# Patient Record
Sex: Male | Born: 1965 | Race: White | Marital: Married | Smoking: Never smoker
Health system: Southern US, Community
[De-identification: ages and names within clinical notes are randomized; demographics above are authoritative.]

## PROBLEM LIST (undated history)

## (undated) DIAGNOSIS — F40240 Claustrophobia: Secondary | ICD-10-CM

## (undated) DIAGNOSIS — G600 Hereditary motor and sensory neuropathy: Secondary | ICD-10-CM

## (undated) DIAGNOSIS — E785 Hyperlipidemia, unspecified: Secondary | ICD-10-CM

## (undated) DIAGNOSIS — G4733 Obstructive sleep apnea (adult) (pediatric): Secondary | ICD-10-CM

## (undated) DIAGNOSIS — R748 Abnormal levels of other serum enzymes: Secondary | ICD-10-CM

## (undated) DIAGNOSIS — K449 Diaphragmatic hernia without obstruction or gangrene: Secondary | ICD-10-CM

## (undated) DIAGNOSIS — K2270 Barrett's esophagus without dysplasia: Secondary | ICD-10-CM

## (undated) DIAGNOSIS — R7303 Prediabetes: Secondary | ICD-10-CM

## (undated) DIAGNOSIS — Z9289 Personal history of other medical treatment: Secondary | ICD-10-CM

## (undated) DIAGNOSIS — G4730 Sleep apnea, unspecified: Secondary | ICD-10-CM

## (undated) DIAGNOSIS — G629 Polyneuropathy, unspecified: Secondary | ICD-10-CM

## (undated) DIAGNOSIS — F419 Anxiety disorder, unspecified: Secondary | ICD-10-CM

## (undated) DIAGNOSIS — I10 Essential (primary) hypertension: Secondary | ICD-10-CM

## (undated) DIAGNOSIS — K219 Gastro-esophageal reflux disease without esophagitis: Secondary | ICD-10-CM

## (undated) HISTORY — DX: Hyperlipidemia, unspecified: E78.5

## (undated) HISTORY — DX: Sleep apnea, unspecified: G47.30

## (undated) HISTORY — PX: WISDOM TOOTH EXTRACTION: SHX21

## (undated) HISTORY — DX: Essential (primary) hypertension: I10

## (undated) HISTORY — PX: COLONOSCOPY: SHX174

## (undated) HISTORY — PX: ESOPHAGOGASTRODUODENOSCOPY ENDOSCOPY: SHX5814

## (undated) HISTORY — DX: Anxiety disorder, unspecified: F41.9

## (undated) HISTORY — DX: Gastro-esophageal reflux disease without esophagitis: K21.9

## (undated) HISTORY — DX: Barrett's esophagus without dysplasia: K22.70

## (undated) HISTORY — PX: VASECTOMY: SHX75

---

## 2020-04-15 ENCOUNTER — Ambulatory Visit: Payer: PRIVATE HEALTH INSURANCE | Admitting: Neurology

## 2020-07-16 ENCOUNTER — Ambulatory Visit: Payer: Self-pay | Admitting: Gastroenterology

## 2020-07-18 ENCOUNTER — Encounter: Payer: Self-pay | Admitting: Internal Medicine

## 2020-07-18 ENCOUNTER — Ambulatory Visit (INDEPENDENT_AMBULATORY_CARE_PROVIDER_SITE_OTHER): Payer: BC Managed Care – PPO | Admitting: Internal Medicine

## 2020-07-18 VITALS — BP 150/90 | HR 81 | Ht 71.0 in | Wt 216.0 lb

## 2020-07-18 DIAGNOSIS — K2270 Barrett's esophagus without dysplasia: Secondary | ICD-10-CM

## 2020-07-18 DIAGNOSIS — K219 Gastro-esophageal reflux disease without esophagitis: Secondary | ICD-10-CM | POA: Diagnosis not present

## 2020-07-18 MED ORDER — FAMOTIDINE 20 MG PO TABS
20.0000 mg | ORAL_TABLET | Freq: Every day | ORAL | 3 refills | Status: DC
Start: 2020-07-18 — End: 2023-06-30

## 2020-07-18 MED ORDER — OMEPRAZOLE 40 MG PO CPDR
40.0000 mg | DELAYED_RELEASE_CAPSULE | Freq: Every day | ORAL | 3 refills | Status: DC
Start: 2020-07-18 — End: 2021-07-06

## 2020-07-18 NOTE — Patient Instructions (Addendum)
If you are age 56 or older, your body mass index should be between 23-30. Your Body mass index is 30.13 kg/m. If this is out of the aforementioned range listed, please consider follow up with your Primary Care Provider.  If you are age 38 or younger, your body mass index should be between 19-25. Your Body mass index is 30.13 kg/m. If this is out of the aformentioned range listed, please consider follow up with your Primary Care Provider.   We have sent the following medications to your pharmacy for you to pick up at your convenience: Omeprazole 40 mg and Famotidine 20 mg.

## 2020-07-18 NOTE — Progress Notes (Signed)
Patient ID: Edward [name], male   DOB: 04-Mar-1966, 53 y.o.   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 Laney Pastor to establish care regarding his reflux disease, Barrett's esophagus. He is here alone today.  He has a history of gastroesophageal reflux disease and heartburn dating back many years. This was initially diagnosed by Dr. West Carbo in [place]. At initial endoscopy it sounds like he had esophagitis as well as Barrett's esophagus. He was started on PPI therapy and had surveillance endoscopy with Dr. West Carbo with improvement in esophagitis. He then switched to Dr. Epimenio Foot at [facility] when Dr. West Carbo retired. He had one upper endoscopy at [facility] in December 2019. See below for details. He has had prior colonoscopy with Dr. West Carbo but he feels that this was less than 10 years ago. He does not recall any abnormalities at this colonoscopy. No family history of colon cancer.  From a GI perspective now he reports his heartburn is well controlled on omeprazole which he takes 40 mg in the morning and famotidine 20 mg in the evening. He can rarely forget the famotidine dose and if he does he can wake up with nocturnal pyrosis which can be moderate. No dysphagia or odynophagia. No weight loss. No nausea or vomiting. Bowel movements are regular. He often has a solid stool each morning and can have a looser stool in the afternoon but this is quite rare. No change in bowel habits, blood in stool or melena.  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 he is married with 2 children. He does drink alcohol 2-4 drinks daily. No caffeine. No tobacco and no former tobacco use. No illicit drug use.  Past Medical History:  Diagnosis Date   Anxiety    Barrett's  esophagus    GERD (gastroesophageal reflux disease)    Hyperlipidemia    Hypertension     History reviewed. No pertinent surgical history.  Outpatient Medications Prior to Visit  Medication Sig Dispense Refill   ALPRAZolam (XANAX) 0.5 MG tablet Take 0.5 mg by mouth 3 (three) times daily as needed.     atorvastatin (LIPITOR) 40 MG tablet Take 40 mg by mouth at bedtime.     benazepril (LOTENSIN) 20 MG tablet Take 20 mg by mouth daily.     famotidine (PEPCID) 20 MG tablet Take 20 mg by mouth 2 (two) times daily.     omeprazole (PRILOSEC) 40 MG capsule Take 40 mg by mouth daily.      No facility-administered medications prior to visit.    No Known Allergies  Family History  Problem Relation Age of Onset   Colon polyps Mother    Diabetes Mother    Heart disease Father    Cancer Paternal Grandmother        ?    Social History   Tobacco Use   Smoking status: Never Smoker   Smokeless tobacco: Never Used  Substance Use Topics   Alcohol use: Yes    Alcohol/week: 4.0 standard drinks    Types: 4 Glasses of wine per week    Comment: 4 drinks  a day   Drug use: Never    ROS: As per history of present illness, otherwise negative  BP (!) 150/90    Pulse 81    Ht 5\' 11"  (1.803  m)    Wt 216 lb (98 kg)    BMI 30.13 kg/m  Constitutional: Well-developed and well-nourished. No distress. HEENT: Normocephalic and atraumatic.  Conjunctivae are normal.  No scleral icterus. Cardiovascular: Normal rate, regular rhythm and intact distal pulses. No M/R/G Pulmonary/chest: Effort normal and breath sounds normal. No wheezing, rales or rhonchi. Abdominal: Soft, nontender, nondistended. Bowel sounds active throughout. There are no masses palpable. No hepatosplenomegaly. Extremities: no clubbing, cyanosis, or edema Neurological: Alert and oriented to person place and time. Skin: Skin is warm and dry.  Psychiatric: Normal mood and affect. Behavior is normal.  RELEVANT GI  PROCEDURES: EGD performed on 08/25/2018, [facility], Dr. Lacinda Axon --Mucosal changes consistent with short segment Barrett's esophagus involving the mucosa 34 to 35 cm from the incisors. Scattered islands of salmon-colored mucosa at 33 cm. Maximal extent 3 cm. Biopsied. Medium size hiatal hernia. Diffuse erythematous mucosa in the gastric antrum which was biopsied. Normal examined duodenum. Pathology: Chronic inflammation of cardia type gastric mucosa negative for Barrett's esophagus.  Squamous mucosa with changes consistent with reflux esophagitis.  Negative for dysplasia.  Gastric biopsies moderate chronic active gastritis positive for H. Pylori. --He was then treated with amoxicillin, Biaxin and Prilosec. --H. pylori stool antigen 11/09/2018 not detected; confirmation of eradication  EGD performed 10/07/2014, Dr. Epimenio Foot --Mucosal changes consistent with short segment Barrett's esophagus Prague criteria C1-M3.  Biopsied.  Small hiatus hernia.  Patchy erythematous mucosa antrum.  Normal duodenum.  Pathology: Focal intestinal metaplasia consistent with Barrett's esophagus.  No dysplasia.  This was the finding at both 34 and 35 cm.  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 Laney Pastor to establish care regarding his reflux disease, Barrett's esophagus.  1.  GERD with Barrett's esophagus, short segment without dysplasia --reflux is well controlled currently on current therapy.  He is up-to-date with surveillance endoscopy.  He actually did not have Barrett's esophagus in 2019 but did in early 2016.  I recommended we continue current therapy and perform surveillance upper endoscopy in December 2022 --Continue omeprazole 40 mg each morning and famotidine 20 mg in the evening --EGD December 2022  for Barrett's surveillance  2.  Colon cancer screening --prior colonoscopy with Dr. West Carbo in [place].  We will request these records to determine when next screening interval is recommended.  Average risk for screening interval for him is 10 years  Annual follow-up, sooner if needed   Cc: Dr. Laney Pastor, MD [employer], [place]

## 2020-08-10 NOTE — Progress Notes (Signed)
Cardiology Office Note   Date:  08/11/2020   ID:  Edward [name], DOB 05-May-1966, MRN [id number]  PCP:  [name], MD  Cardiologist:   No primary care provider on file. Referring:  [name], MD  Chief Complaint  Patient presents with  . Anxiety      History of Present Illness: Edward [name] is a 54 y.o. male who is referred for evaluation of HTN by Hungarland, Jenetta Downer, MD.  The patient has also had a history of chest discomfort.  Is been followed by a cardiologist in [place] but is wanting to switch care.  He reports having a stress test about 10 years ago that was apparently negative for any evidence of ischemia.  I was able to review some notes from her previous cardiology appointment in [place].  He had normal left ventricular ejection fraction.  There were no significant abnormalities on this echo.  This was from September.  He is being seen for difficult to control hypertension.  He has a history of anxiety.  He thinks that this does compound some of there is blood pressure problems.  He works doing deliveries for schools.  He [name] be physically active without bringing on currently any chest discomfort, neck or arm discomfort.  He has no new shortness of breath, PND or orthopnea.  Is not describing any palpitations, presyncope or syncope.  He has no weight gain or edema.   Past Medical History:  Diagnosis Date  . Anxiety   . Barrett's esophagus   . GERD (gastroesophageal reflux disease)   . Hyperlipidemia   . Hypertension     History reviewed. No pertinent surgical history.   Current Outpatient Medications  Medication Sig Dispense Refill  . ALPRAZolam (XANAX) 0.5 MG tablet Take 0.5 mg by mouth 3 (three) times daily as needed.    Marland Kitchen atorvastatin (LIPITOR) 40 MG tablet Take 40 mg by mouth at bedtime.    . famotidine (PEPCID) 20 MG tablet Take 1 tablet (20 mg total) by mouth at bedtime. 90 tablet 3  . omeprazole (PRILOSEC) 40 MG  capsule Take 1 capsule (40 mg total) by mouth daily. 90 capsule 3  . valsartan-hydrochlorothiazide (DIOVAN HCT) 80-12.5 MG tablet Take 1 tablet by mouth daily. 90 tablet 3   No current facility-administered medications for this visit.    Allergies:   Patient has no known allergies.    Social History:  The patient  reports that he has never smoked. He has never used smokeless tobacco. He reports current alcohol use of about 4.0 standard drinks of alcohol per week. He reports that he does not use drugs.   Family History:  The patient's family history includes Cancer in his paternal grandmother; Colon polyps in his mother; Diabetes in his mother; Heart attack in his father and paternal grandfather; Heart disease in his father and another family member; Multiple sclerosis in his mother; Stroke in his father.    ROS:  Please see the history of present illness.   Otherwise, review of systems are positive for none.   All other systems are reviewed and negative.    PHYSICAL EXAM: VS:  BP (!) 140/100   Pulse 86   Ht 5\' 11"  (1.803 m)   Wt 219 lb (99.3 kg)   SpO2 98%   BMI 30.54 kg/m  , BMI Body mass index is 30.54 kg/m. GENERAL:  Well appearing HEENT:  Pupils equal round and reactive, fundi not visualized, oral mucosa unremarkable NECK:  No jugular venous distention, waveform within normal limits, carotid upstroke brisk and symmetric, no bruits, no thyromegaly LYMPHATICS:  No cervical, inguinal adenopathy LUNGS:  Clear to auscultation bilaterally BACK:  No CVA tenderness CHEST:  Unremarkable HEART:  PMI not displaced or sustained,S1 and S2 within normal limits, no S3, no S4, no clicks, no rubs, no murmurs ABD:  Flat, positive bowel sounds normal in frequency in pitch, no bruits, no rebound, no guarding, no midline pulsatile mass, no hepatomegaly, no splenomegaly EXT:  2 plus pulses throughout, no edema, no cyanosis no clubbing SKIN:  No rashes no nodules NEURO:  Cranial nerves II through  XII grossly intact, motor grossly intact throughout PSYCH:  Cognitively intact, oriented to person place and time    EKG:  EKG is ordered today. The ekg ordered today demonstrates sinus rhythm, rate 86, axis within normal limits, intervals within normal limits, no acute ST-T wave changes.   Recent Labs: No results found for requested labs within last 8760 hours.    Lipid Panel No results found for: CHOL, TRIG, HDL, CHOLHDL, VLDL, LDLCALC, LDLDIRECT    Wt Readings from Last 3 Encounters:  08/11/20 219 lb (99.3 kg)  07/18/20 216 lb (98 kg)      Other studies Reviewed: Additional studies/ records that were reviewed today include: Primary care and cardiology office records from [place]. Review of the above records demonstrates:  Please see elsewhere in the note.     ASSESSMENT AND PLAN:  HTN: His blood pressure has been difficult to control.  We talked about this at length.  We talked the physiology.  I am going to stop his Lotensin and start him on valsartan HCT 80/12.5 daily.  We talked about weight loss.  He already follows salt restriction.  He is physically active.  We talked about anxiety as a contributor.  OVERWEIGHT: As above  DYSLIPIDEMIA: I do note that he has dyslipidemia.  His LDL was 144 and he was recently started on statin.  Given his family history I think this is reasonable.  The goal should be an LDL less than 100 at least.   Current medicines are reviewed at length with the patient today.  The patient does not have concerns regarding medicines.  The following changes have been made:  no change  Labs/ tests ordered today include:   Orders Placed This Encounter  Procedures  . EKG 12-Lead     Disposition:   FU with me in 3 months.   Signed, Minus Breeding, MD  08/11/2020 11:57 AM    [facility]

## 2020-08-11 ENCOUNTER — Telehealth: Payer: Self-pay

## 2020-08-11 ENCOUNTER — Encounter: Payer: Self-pay | Admitting: Cardiology

## 2020-08-11 ENCOUNTER — Other Ambulatory Visit: Payer: Self-pay

## 2020-08-11 ENCOUNTER — Ambulatory Visit (INDEPENDENT_AMBULATORY_CARE_PROVIDER_SITE_OTHER): Payer: BC Managed Care – PPO | Admitting: Cardiology

## 2020-08-11 VITALS — BP 140/100 | HR 86 | Ht 71.0 in | Wt 219.0 lb

## 2020-08-11 DIAGNOSIS — I10 Essential (primary) hypertension: Secondary | ICD-10-CM | POA: Diagnosis not present

## 2020-08-11 MED ORDER — VALSARTAN-HYDROCHLOROTHIAZIDE 80-12.5 MG PO TABS: 1.0000 | ORAL_TABLET | Freq: Every day | ORAL | 3 refills | Status: DC

## 2020-08-11 NOTE — Patient Instructions (Signed)
Medication Instructions:  STOP LOTENSIN START DIOVAN 80-12.5MG  DAILY *If you need a refill on your cardiac medications before your next appointment, please call your pharmacy*  Lab Work: NONE ORDERED THIS VISIT  Testing/Procedures: NONE ORDERED THIS VISIT  Follow-Up: At [facility], you and your health needs are our priority.  As part of our continuing mission to provide you with exceptional heart care, we have created designated Provider Care Teams.  These Care Teams include your primary Cardiologist (physician) and Advanced Practice Providers (APPs -  Physician Assistants and Nurse Practitioners) who all work together to provide you with the care you need, when you need it.  We recommend signing up for the patient portal called "MyChart".  Sign up information is provided on this After Visit Summary.  MyChart is used to connect with patients for Virtual Visits (Telemedicine).  Patients are able to view lab/test results, encounter notes, upcoming appointments, etc.  Non-urgent messages can be sent to your provider as well.   To learn more about what you can do with MyChart, go to [email or web address].    Your next appointment:   3 month(s)  The format for your next appointment:   In Person  Provider:   Minus Breeding, MD

## 2020-08-11 NOTE — Telephone Encounter (Signed)
Called to have records faxed for 11am appointment.

## 2020-10-14 ENCOUNTER — Other Ambulatory Visit: Payer: Self-pay

## 2020-10-14 ENCOUNTER — Telehealth: Payer: Self-pay | Admitting: Internal Medicine

## 2020-10-14 ENCOUNTER — Other Ambulatory Visit: Payer: BC Managed Care – PPO

## 2020-10-14 DIAGNOSIS — R197 Diarrhea, unspecified: Secondary | ICD-10-CM

## 2020-10-14 NOTE — Telephone Encounter (Signed)
Pt states that he has been having severe diarrhea since last Tuesday. He went to urgent care yesterday and was prescribed Hyoscyamine 0.125 mg, take 1 tablet every 4 hours for 3 days. He wants to know if Dr. Hilarie Fredrickson is fine with that and what his recommendations are to help him.

## 2020-10-14 NOTE — Telephone Encounter (Signed)
Gi pathogen panel Focus on hydration and BRAT diet

## 2020-10-14 NOTE — Telephone Encounter (Signed)
Called patient back and he states since last Thursday 10/09/20 he has been having dark liquid stool, several times a day and at night. Only 1 stool in the last 6 days was semi-formed. He has had mid-abdominal pain and nausea, but denies fever, emesis. He has been eating a bland diet and pushing fluids. He went to Urgent Care yesterday and they gave him Hyoscyamine, of which he has taken 3 doses with no improvement. Please advise.

## 2020-10-14 NOTE — Telephone Encounter (Signed)
Order in [place] for GI pathogen panel. Called patient and he will come in today or tomorrow for this. Asked him to stay on a BRAT diet and continue to push fluids. He agreed.

## 2020-10-15 ENCOUNTER — Telehealth: Payer: Self-pay | Admitting: Internal Medicine

## 2020-10-16 ENCOUNTER — Telehealth: Payer: Self-pay | Admitting: Internal Medicine

## 2020-10-16 LAB — GI PROFILE, STOOL, PCR
Adenovirus F 40/41: NOT DETECTED
Astrovirus: NOT DETECTED
C difficile toxin A/B: NOT DETECTED
Campylobacter: NOT DETECTED
Cryptosporidium: NOT DETECTED
Cyclospora cayetanensis: NOT DETECTED
Entamoeba histolytica: NOT DETECTED
Enteroaggregative E coli: NOT DETECTED
Enteropathogenic E coli: NOT DETECTED
Enterotoxigenic E coli: DETECTED — AB
Giardia lamblia: NOT DETECTED
Norovirus GI/GII: NOT DETECTED
Plesiomonas shigelloides: NOT DETECTED
Rotavirus A: NOT DETECTED
Salmonella: NOT DETECTED
Sapovirus: NOT DETECTED
Shiga-toxin-producing E coli: NOT DETECTED
Shigella/Enteroinvasive E coli: NOT DETECTED
Vibrio cholerae: NOT DETECTED
Vibrio: NOT DETECTED
Yersinia enterocolitica: NOT DETECTED

## 2020-10-16 NOTE — Telephone Encounter (Signed)
See result note.  

## 2020-10-16 NOTE — Telephone Encounter (Signed)
Inbound call from [place] stating patient's Enterotoxigenic E coli came back positive.

## 2020-10-16 NOTE — Telephone Encounter (Signed)
See additional note. 

## 2020-10-16 NOTE — Telephone Encounter (Signed)
Pt calling for lab results, please advise. 

## 2020-10-17 NOTE — Telephone Encounter (Signed)
Spoke to pt and he is aware. Letter sent to him via mychart.

## 2020-10-17 NOTE — Telephone Encounter (Signed)
Infection should be better by 1 week.  Okay to return to work once diarrhea is gone.  Work note if needed

## 2020-10-17 NOTE — Telephone Encounter (Signed)
Pt had e coli intestinal infection. He has been told to wash his hands well after toileting. Pt wants to know how long this can be transmitted and if he needs to be out of work. 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 Please advise.

## 2020-10-17 NOTE — Telephone Encounter (Signed)
Patient calling back; has additional questions about his results.

## 2020-11-09 DIAGNOSIS — E785 Hyperlipidemia, unspecified: Secondary | ICD-10-CM | POA: Insufficient documentation

## 2020-11-09 DIAGNOSIS — E663 Overweight: Secondary | ICD-10-CM | POA: Insufficient documentation

## 2020-11-09 DIAGNOSIS — I10 Essential (primary) hypertension: Secondary | ICD-10-CM | POA: Insufficient documentation

## 2020-11-09 NOTE — Progress Notes (Unsigned)
Cardiology Office Note   Date:  11/10/2020   ID:  Edward [name], DOB Jul 19, 1966, MRN [id number]  PCP:  [name], MD  Cardiologist:   No primary care provider on file. Referring:  [name], MD  Chief Complaint  Patient presents with  . Elevated Cholesterol      History of Present Illness: Edward [name] is a 55 y.o. male who was referred for evaluation of HTN by Hungarland, Jenetta Downer, MD.  The patient has also had a history of chest discomfort.  He was followed by a cardiologist in [place] but switched care.  He reports having a stress test about 10 years ago that was apparently negative for any evidence of ischemia.  I was able to review some notes from her previous cardiology appointment in [place].  He had normal left ventricular ejection fraction.  There were no significant abnormalities on this echo.  This was from September 2021.   At the last visit I started valsartan 80/12.5.  He is bringing his blood pressure diary to me and his diastolics are consistently above 100.  His systolics seem to be in about the 135 range.  He denies any cardiovascular symptoms. The patient denies any new symptoms such as chest discomfort, neck or arm discomfort. There has been no new shortness of breath, PND or orthopnea. There have been no reported palpitations, presyncope or syncope.    Past Medical History:  Diagnosis Date  . Anxiety   . Barrett's esophagus   . GERD (gastroesophageal reflux disease)   . Hyperlipidemia   . Hypertension     History reviewed. No pertinent surgical history.   Current Outpatient Medications  Medication Sig Dispense Refill  . ALPRAZolam (XANAX) 0.5 MG tablet Take 0.5 mg by mouth 3 (three) times daily as needed.    Marland Kitchen atorvastatin (LIPITOR) 40 MG tablet Take 40 mg by mouth at bedtime.    . famotidine (PEPCID) 20 MG tablet Take 1 tablet (20 mg total) by mouth at bedtime. 90 tablet 3  . omeprazole (PRILOSEC) 40 MG capsule  Take 1 capsule (40 mg total) by mouth daily. 90 capsule 3  . valsartan-hydrochlorothiazide (DIOVAN HCT) 160-12.5 MG tablet Take 1 tablet by mouth daily. 90 tablet 3   No current facility-administered medications for this visit.    Allergies:   Patient has no known allergies.    ROS:  Please see the history of present illness.   Otherwise, review of systems are positive for none.   All other systems are reviewed and negative.    PHYSICAL EXAM: VS:  BP (!) 138/100   Pulse 82   Ht 5\' 11"  (1.803 m)   Wt 220 lb 9.6 oz (100.1 kg)   SpO2 98%   BMI 30.77 kg/m  , BMI Body mass index is 30.77 kg/m. GENERAL:  Well appearing NECK:  No jugular venous distention, waveform within normal limits, carotid upstroke brisk and symmetric, no bruits, no thyromegaly LUNGS:  Clear to auscultation bilaterally CHEST:  Unremarkable HEART:  PMI not displaced or sustained,S1 and S2 within normal limits, no S3, no S4, no clicks, no rubs, no murmurs ABD:  Flat, positive bowel sounds normal in frequency in pitch, no bruits, no rebound, no guarding, no midline pulsatile mass, no hepatomegaly, no splenomegaly EXT:  2 plus pulses throughout, no edema, no cyanosis no clubbing   EKG:  EKG is not ordered today.   Recent Labs: No results found for requested labs within last 8760 hours.  Lipid Panel No results found for: CHOL, TRIG, HDL, CHOLHDL, VLDL, LDLCALC, LDLDIRECT    Wt Readings from Last 3 Encounters:  11/10/20 220 lb 9.6 oz (100.1 kg)  08/11/20 219 lb (99.3 kg)  07/18/20 216 lb (98 kg)      Other studies Reviewed: Additional studies/ records that were reviewed today include: Labs Review of the above records demonstrates:  Please see elsewhere in the note.     ASSESSMENT AND PLAN:  HTN:   His blood pressure not quite at target.  I am going to increase his valsartan to 180/12 and half.  He will continue to keep his blood pressure diary and if he is not at target we might consider switching  this to nighttime to see if that makes any difference or otherwise he will need further med titration.  DYSLIPIDEMIA:   His LDL was elevated.  He does have a strong family history.  I am going to order a coronary calcium score to further set her therapy.  I did start him on statin as above.  I am going to check a fasting lipid profile.  He has had some mildly elevated liver enzymes in the past so we will repeat this.  I also do not see a CBC in any of his old blood work and I will check this.    Current medicines are reviewed at length with the patient today.  The patient does not have concerns regarding medicines.  The following changes have been made:  As above  Labs/ tests ordered today include:   Orders Placed This Encounter  Procedures  . CT CARDIAC SCORING (SELF PAY ONLY)  . Lipid panel  . Hepatic function panel  . CBC     Disposition:   FU with me in 3 months.   Signed, Minus Breeding, MD  11/10/2020 11:49 AM    [facility]

## 2020-11-10 ENCOUNTER — Other Ambulatory Visit: Payer: Self-pay

## 2020-11-10 ENCOUNTER — Ambulatory Visit (INDEPENDENT_AMBULATORY_CARE_PROVIDER_SITE_OTHER): Payer: BC Managed Care – PPO | Admitting: Cardiology

## 2020-11-10 ENCOUNTER — Encounter: Payer: Self-pay | Admitting: Cardiology

## 2020-11-10 VITALS — BP 138/100 | HR 82 | Ht 71.0 in | Wt 220.6 lb

## 2020-11-10 DIAGNOSIS — E785 Hyperlipidemia, unspecified: Secondary | ICD-10-CM | POA: Diagnosis not present

## 2020-11-10 DIAGNOSIS — Z8249 Family history of ischemic heart disease and other diseases of the circulatory system: Secondary | ICD-10-CM

## 2020-11-10 DIAGNOSIS — I10 Essential (primary) hypertension: Secondary | ICD-10-CM | POA: Diagnosis not present

## 2020-11-10 DIAGNOSIS — E663 Overweight: Secondary | ICD-10-CM | POA: Diagnosis not present

## 2020-11-10 LAB — LIPID PANEL
Chol/HDL Ratio: 2.7 ratio (ref 0.0–5.0)
Cholesterol, Total: 198 mg/dL (ref 100–199)
HDL: 73 mg/dL (ref >39)
LDL Chol Calc (NIH): 102 mg/dL — ABNORMAL HIGH (ref 0–99)
Triglycerides: 132 mg/dL (ref 0–149)
VLDL Cholesterol Cal: 23 mg/dL (ref 5–40)

## 2020-11-10 LAB — CBC
Hematocrit: 51 % (ref 37.5–51.0)
Hemoglobin: 17.4 g/dL (ref 13.0–17.7)
MCH: 32.5 pg (ref 26.6–33.0)
MCHC: 34.1 g/dL (ref 31.5–35.7)
MCV: 95 fL (ref 79–97)
Platelets: 208 10*3/uL (ref 150–450)
RBC: 5.35 x10E6/uL (ref 4.14–5.80)
RDW: 12.7 % (ref 11.6–15.4)
WBC: 6.9 10*3/uL (ref 3.4–10.8)

## 2020-11-10 LAB — HEPATIC FUNCTION PANEL
ALT: 46 IU/L — ABNORMAL HIGH (ref 0–44)
AST: 32 IU/L (ref 0–40)
Albumin: 4.9 g/dL (ref 3.8–4.9)
Alkaline Phosphatase: 88 IU/L (ref 44–121)
Bilirubin Total: 0.6 mg/dL (ref 0.0–1.2)
Bilirubin, Direct: 0.16 mg/dL (ref 0.00–0.40)
Total Protein: 7.3 g/dL (ref 6.0–8.5)

## 2020-11-10 MED ORDER — VALSARTAN-HYDROCHLOROTHIAZIDE 160-12.5 MG PO TABS: 1.0000 | ORAL_TABLET | Freq: Every day | ORAL | 3 refills | Status: DC

## 2020-11-10 NOTE — Patient Instructions (Signed)
Medication Instructions:  Increase Diovan to 160-12.5mg  daily, new prescription sent to [facility] on [address] *If you need a refill on your cardiac medications before your next appointment, please call your pharmacy*  Lab Work: Your physician recommends that you return for lab work today: CBC, Lipids, Liver If you have labs (blood work) drawn today and your tests are completely normal, you will receive your results only by: Marland Kitchen MyChart Message (if you have MyChart) OR . A paper copy in the mail If you have any lab test that is abnormal or we need to change your treatment, we will call you to review the results.  Testing/Procedures: CT Calcium Score on day of next appointment  Follow-Up: At [facility], you and your health needs are our priority.  As part of our continuing mission to provide you with exceptional heart care, we have created designated Provider Care Teams.  These Care Teams include your primary Cardiologist (physician) and Advanced Practice Providers (APPs -  Physician Assistants and Nurse Practitioners) who all work together to provide you with the care you need, when you need it.  MyChart is used to connect with patients for Virtual Visits (Telemedicine).  Patients are able to view lab/test results, encounter notes, upcoming appointments, etc.  Non-urgent messages can be sent to your provider as well.    Your next appointment:   3 month(s)  The format for your next appointment:   In Person  Provider:   Minus Breeding, MD

## 2021-01-12 ENCOUNTER — Other Ambulatory Visit: Payer: BC Managed Care – PPO

## 2021-02-11 NOTE — Progress Notes (Signed)
Cardiology Office Note   Date:  02/12/2021   ID:  Edward [name], DOB Aug 24, 1966, MRN [id number]  PCP:  [name], MD  Cardiologist:   None Referring:  [name], MD  No chief complaint on file.     History of Present Illness: Arthor [name] is a 55 y.o. male who was referred for evaluation of HTN by Hungarland, Jenetta Downer, MD.  The patient has also had a history of chest discomfort.  He was followed by a cardiologist in [place] but switched care.  He reports having a stress test about 10 years ago that was apparently negative for any evidence of ischemia.  I was able to review some notes from her previous cardiology appointment in [place].  He had normal left ventricular ejection fraction.  There were no significant abnormalities on this echo.  This was from September 2021.   At the last visit I increased his Valsartan/HCT.   He comes with excellent blood pressure readings and he still having morning blood pressures that are in the 140s over 100 range fairly consistently.  With the increased blood pressure medicine he did come down but he still not quite at target.  He has some snoring and daytime somnolence (high Epworth sleepiness scale).  He denies any chest pressure, neck or arm discomfort.  He had no new shortness of breath, PND or orthopnea.  He did have a coronary calcium score today and I can see that he has a large hiatal hernia but I do not yet have the score.  I do see some calcium on up luminary readings  Past Medical History:  Diagnosis Date   Anxiety    Barrett's esophagus    GERD (gastroesophageal reflux disease)    Hyperlipidemia    Hypertension     No past surgical history on file.   Current Outpatient Medications  Medication Sig Dispense Refill   ALPRAZolam (XANAX) 0.5 MG tablet Take 0.5 mg by mouth 3 (three) times daily as needed.     atorvastatin (LIPITOR) 40 MG tablet Take 40 mg by mouth at bedtime.     famotidine (PEPCID)  20 MG tablet Take 1 tablet (20 mg total) by mouth at bedtime. 90 tablet 3   omeprazole (PRILOSEC) 40 MG capsule Take 1 capsule (40 mg total) by mouth daily. 90 capsule 3   valsartan-hydrochlorothiazide (DIOVAN HCT) 160-12.5 MG tablet Take 1 tablet by mouth daily. 90 tablet 3   No current facility-administered medications for this visit.    Allergies:   Patient has no known allergies.    ROS:  Please see the history of present illness.   Otherwise, review of systems are positive for none.   All other systems are reviewed and negative.    PHYSICAL EXAM: VS:  BP (!) 156/92 (BP Location: Left Arm, Patient Position: Sitting, Cuff Size: Normal)   Pulse 84   Ht 5\' 11"  (1.803 m)   Wt 221 lb 9.6 oz (100.5 kg)   SpO2 96%   BMI 30.91 kg/m  , BMI Body mass index is 30.91 kg/m. GENERAL:  Well appearing NECK:  No jugular venous distention, waveform within normal limits, carotid upstroke brisk and symmetric, no bruits, no thyromegaly LUNGS:  Clear to auscultation bilaterally CHEST:  Unremarkable HEART:  PMI not displaced or sustained,S1 and S2 within normal limits, no S3, no S4, no clicks, no rubs, no murmurs ABD:  Flat, positive bowel sounds normal in frequency in pitch, no bruits, no rebound, no  guarding, no midline pulsatile mass, no hepatomegaly, no splenomegaly EXT:  2 plus pulses throughout, no edema, no cyanosis no clubbing   EKG:  EKG is not ordered today.   Recent Labs: 11/10/2020: ALT 46; Hemoglobin 17.4; Platelets 208    Lipid Panel    Component Value Date/Time   CHOL 198 11/10/2020 1132   TRIG 132 11/10/2020 1132   HDL 73 11/10/2020 1132   CHOLHDL 2.7 11/10/2020 1132   LDLCALC 102 (H) 11/10/2020 1132      Wt Readings from Last 3 Encounters:  02/12/21 221 lb 9.6 oz (100.5 kg)  11/10/20 220 lb 9.6 oz (100.1 kg)  08/11/20 219 lb (99.3 kg)      Other studies Reviewed: Additional studies/ records that were reviewed today include: Calcium score and BP diary. Review of  the above records demonstrates:  Please see elsewhere in the note.     ASSESSMENT AND PLAN:  HTN:   The blood pressure still not quite at target.  I will get a add amlodipine 2.5 mg at night.  DYSLIPIDEMIA:   His LDL was 102.  Goals of therapy will be based on his calcium score.  ELEVATED CORONARY CALCIUM: I see some of this preliminarily on his CT but I will await the results.  He may need stress testing.  We will discuss risk reduction.  SNORING: Given this and his high sleepiness score as well as difficult to control hypertension I am going to add a sleep study.  Current medicines are reviewed at length with the patient today.  The patient does not have concerns regarding medicines.  The following changes have been made:   As above  Labs/ tests ordered today include:   No orders of the defined types were placed in this encounter.    Disposition:   FU with me in 12 months.   Signed, Minus Breeding, MD  02/12/2021 11:08 AM    [place]

## 2021-02-12 ENCOUNTER — Other Ambulatory Visit: Payer: Self-pay

## 2021-02-12 ENCOUNTER — Ambulatory Visit (INDEPENDENT_AMBULATORY_CARE_PROVIDER_SITE_OTHER): Payer: BC Managed Care – PPO | Admitting: Cardiology

## 2021-02-12 ENCOUNTER — Ambulatory Visit (INDEPENDENT_AMBULATORY_CARE_PROVIDER_SITE_OTHER)
Admission: RE | Admit: 2021-02-12 | Discharge: 2021-02-12 | Disposition: A | Discharge status: Home / Self Care | Payer: Self-pay | Source: Ambulatory Visit | Attending: Cardiology | Admitting: Cardiology

## 2021-02-12 ENCOUNTER — Encounter: Payer: Self-pay | Admitting: Cardiology

## 2021-02-12 VITALS — BP 156/92 | HR 84 | Ht 71.0 in | Wt 221.6 lb

## 2021-02-12 DIAGNOSIS — R0683 Snoring: Secondary | ICD-10-CM

## 2021-02-12 DIAGNOSIS — E785 Hyperlipidemia, unspecified: Secondary | ICD-10-CM

## 2021-02-12 DIAGNOSIS — I10 Essential (primary) hypertension: Secondary | ICD-10-CM | POA: Diagnosis not present

## 2021-02-12 DIAGNOSIS — Z8249 Family history of ischemic heart disease and other diseases of the circulatory system: Secondary | ICD-10-CM

## 2021-02-12 MED ORDER — AMLODIPINE BESYLATE 2.5 MG PO TABS: 2.5000 mg | ORAL_TABLET | Freq: Every day | ORAL | 3 refills | Status: DC

## 2021-02-12 NOTE — Patient Instructions (Addendum)
Medication Instructions:  START AMLODIPINE 2.5 MG AT BEDTIME   *If you need a refill on your cardiac medications before your next appointment, please call your pharmacy*  Lab Work: NONE   Testing/Procedures: Your physician has recommended that you have a sleep study. This test records several body functions during sleep, including: brain activity, eye movement, oxygen and carbon dioxide blood levels, heart rate and rhythm, breathing rate and rhythm, the flow of air through your mouth and nose, snoring, body muscle movements, and chest and belly movement. ONCE INSURANCE HAS BEEN REVIEWED THE OFFICE WILL CALL YOU TO ARRANGE   Follow-Up: At [facility], you and your health needs are our priority.  As part of our continuing mission to provide you with exceptional heart care, we have created designated Provider Care Teams.  These Care Teams include your primary Cardiologist (physician) and Advanced Practice Providers (APPs -  Physician Assistants and Nurse Practitioners) who all work together to provide you with the care you need, when you need it.  We recommend signing up for the patient portal called "MyChart".  Sign up information is provided on this After Visit Summary.  MyChart is used to connect with patients for Virtual Visits (Telemedicine).  Patients are able to view lab/test results, encounter notes, upcoming appointments, etc.  Non-urgent messages can be sent to your provider as well.   To learn more about what you can do with MyChart, go to [email or web address].    Your next appointment:   12 month(s)  The format for your next appointment:   In Person  Provider:   You may see DR [facility]  or one of the following Advanced Practice Providers on your designated Care Team:   Rosaria Ferries, PA-C Jory Sims, DNP, ANP   Other Instructions  CONTINUE TO MONITOR YOUR BLOOD PRESSURE AT HOME, SEND IN UPDATED READINGS

## 2021-02-16 ENCOUNTER — Telehealth: Payer: Self-pay | Admitting: *Deleted

## 2021-02-16 DIAGNOSIS — E785 Hyperlipidemia, unspecified: Secondary | ICD-10-CM

## 2021-02-16 DIAGNOSIS — R931 Abnormal findings on diagnostic imaging of heart and coronary circulation: Secondary | ICD-10-CM

## 2021-02-16 MED ORDER — ATORVASTATIN CALCIUM 80 MG PO TABS: 80.0000 mg | ORAL_TABLET | Freq: Every day | ORAL | 3 refills | Status: DC

## 2021-02-16 NOTE — Telephone Encounter (Addendum)
-----   Message from Minus Breeding, MD sent at 02/15/2021  2:02 PM EDT ----- He has an elevated coronary calcium score.  He needs a POET (Plain Old Exercise Treadmill).   I would suggest that his LDL should be less than 70.  I would like to increase his Lipitor to 80 mg po daily.  Call Edward [name] with the results and send results to Hungarland, Jenetta Downer, MD    pt aware of results  New script sent to the pharmacy  Order placed for GXT Results forwarded

## 2021-02-20 ENCOUNTER — Telehealth: Payer: Self-pay | Admitting: *Deleted

## 2021-02-20 ENCOUNTER — Other Ambulatory Visit: Payer: Self-pay | Admitting: Cardiology

## 2021-02-20 DIAGNOSIS — R0683 Snoring: Secondary | ICD-10-CM

## 2021-02-20 DIAGNOSIS — I10 Essential (primary) hypertension: Secondary | ICD-10-CM

## 2021-02-20 NOTE — Telephone Encounter (Signed)
Patient informed of HST scheduled on 02/27/21 7:30 pm @ [facility].

## 2021-02-26 ENCOUNTER — Telehealth (HOSPITAL_COMMUNITY): Payer: Self-pay | Admitting: *Deleted

## 2021-02-26 NOTE — Telephone Encounter (Signed)
Close encounter 

## 2021-02-27 ENCOUNTER — Ambulatory Visit (HOSPITAL_BASED_OUTPATIENT_CLINIC_OR_DEPARTMENT_OTHER): Payer: BC Managed Care – PPO | Attending: Cardiovascular Disease | Admitting: Cardiovascular Disease

## 2021-02-27 ENCOUNTER — Ambulatory Visit (HOSPITAL_COMMUNITY)
Admission: RE | Admit: 2021-02-27 | Discharge: 2021-02-27 | Disposition: A | Discharge status: Home / Self Care | Payer: BC Managed Care – PPO | Source: Ambulatory Visit | Attending: Cardiology | Admitting: Cardiology

## 2021-02-27 ENCOUNTER — Ambulatory Visit: Payer: BC Managed Care – PPO | Admitting: Cardiovascular Disease

## 2021-02-27 ENCOUNTER — Other Ambulatory Visit: Payer: Self-pay

## 2021-02-27 DIAGNOSIS — R931 Abnormal findings on diagnostic imaging of heart and coronary circulation: Secondary | ICD-10-CM | POA: Diagnosis present

## 2021-02-27 DIAGNOSIS — R0683 Snoring: Secondary | ICD-10-CM

## 2021-02-27 DIAGNOSIS — I10 Essential (primary) hypertension: Secondary | ICD-10-CM

## 2021-02-27 LAB — EXERCISE TOLERANCE TEST
Estimated workload: 12.3 METS
Exercise duration (min): 10 min
Exercise duration (sec): 23 s
MPHR: 164 {beats}/min
Peak HR: 151 {beats}/min
Percent HR: 92 %
Rest HR: 96 {beats}/min

## 2021-03-05 ENCOUNTER — Encounter: Payer: Self-pay | Admitting: *Deleted

## 2021-03-26 ENCOUNTER — Other Ambulatory Visit: Payer: Self-pay

## 2021-03-26 ENCOUNTER — Ambulatory Visit: Payer: BC Managed Care – PPO | Attending: Cardiology | Admitting: Cardiovascular Disease

## 2021-03-26 DIAGNOSIS — E663 Overweight: Secondary | ICD-10-CM

## 2021-03-26 DIAGNOSIS — G4736 Sleep related hypoventilation in conditions classified elsewhere: Secondary | ICD-10-CM | POA: Diagnosis not present

## 2021-03-26 DIAGNOSIS — I10 Essential (primary) hypertension: Secondary | ICD-10-CM

## 2021-03-26 DIAGNOSIS — R0683 Snoring: Secondary | ICD-10-CM | POA: Diagnosis present

## 2021-03-26 DIAGNOSIS — G4733 Obstructive sleep apnea (adult) (pediatric): Secondary | ICD-10-CM | POA: Diagnosis not present

## 2021-03-27 ENCOUNTER — Encounter: Payer: BC Managed Care – PPO | Admitting: Cardiovascular Disease

## 2021-04-07 ENCOUNTER — Encounter: Payer: Self-pay | Admitting: Cardiovascular Disease

## 2021-04-07 NOTE — Procedures (Signed)
            [facility]         Patient Name: Edward [name], Edward [name] Date: 03/26/2021 Gender: Male D.O.B: 1966-06-13 Age (years): 54 Referring Provider: Minus Breeding Height (inches): 75 Interpreting Physician: Shelva Majestic MD, ABSM Weight (lbs): 220 RPSGT: Rosebud Poles BMI: 31 MRN: [id number] Neck Size: 17.75  CLINICAL INFORMATION Sleep Study Type: HST  Indication for sleep study: snoring, daytime sleepiness, difficult to control HTN  Epworth Sleepiness Score: elevated  SLEEP STUDY TECHNIQUE A multi-channel overnight portable sleep study was performed. The channels recorded were: nasal airflow, thoracic respiratory movement, and oxygen saturation with a pulse oximetry. Snoring was also monitored.  MEDICATIONS ALPRAZolam (XANAX) 0.5 MG tablet amLODipine (NORVASC) 2.5 MG tablet atorvastatin (LIPITOR) 80 MG tablet famotidine (PEPCID) 20 MG tablet omeprazole (PRILOSEC) 40 MG capsule valsartan-hydrochlorothiazide (DIOVAN HCT) 160-12.5 MG tablet  Patient self administered medications include: N/A.  SLEEP ARCHITECTURE Patient was studied for 399.9 minutes. The sleep efficiency was 66.7 % and the patient was supine for 66.2%. The arousal index was 0.0 per hour.  RESPIRATORY PARAMETERS The overall AHI was 67.8 per hour, with a central apnea index of 0 per hour.  The oxygen nadir was 63% during sleep.  CARDIAC DATA Mean heart rate during sleep was 63.3 bpm.  IMPRESSIONS - Severe obstructive sleep apnea occurred during this study (AHI 67.8/h). - Severe oxygen desaturation to a nadir of 63%. Time spent < 89% was 223.5 minutes. - Patient snored 7.9% during the sleep.  DIAGNOSIS - Obstructive Sleep Apnea (G47.33) - Nocturnal Hypoxemia (G47.36)  RECOMMENDATIONS - In this patient with cardiovascular comorbidities and very severe sleep apnea with marked oxygen desaturation to 63%, recommend an in-lab CPAP/BiPAP titratin study. If unable to schedule then initiate  Auto-PAP with EPR of 3 at 7 - 20 cm of water. - Effort should be made to optimize nasal and oropharyngeal patency. - Avoid alcohol, sedatives and other CNS depressants that may worsen sleep apnea and disrupt normal sleep architecture. - Sleep hygiene should be reviewed to assess factors that may improve sleep quality. - Weight management and regular exercise should be initiated or continued. - Recommend a download and sleep clinic evaluation ofter one month of therapy.    [Electronically signed] 04/07/2021 03:26 PM  Shelva Majestic MD, [facility], ABSM Diplomate, American Board of Sleep Medicine   NPI: [id number]  [place] PH: [phone]   FX: [phone] [place]

## 2021-04-16 ENCOUNTER — Telehealth: Payer: Self-pay | Admitting: *Deleted

## 2021-04-16 NOTE — Telephone Encounter (Signed)
-----   Message from Troy Sine, MD sent at 04/07/2021  3:31 PM EDT ----- Mariann Laster, please notify pt and try to schedule an in-lab CPAP titration study; if unable Auto-PAP

## 2021-04-16 NOTE — Telephone Encounter (Signed)
Patient notified of HST results and recommendations. He agrees to proceed with titration study. Prior Authorization for titration study sent to [facility] via web portal. Tracking Number [id number].

## 2021-04-20 NOTE — Telephone Encounter (Signed)
Received a denial from [facility] for CPAP titration. Apap order sent to [facility] for emergent processing, as patient called me today stating that he really is tired and fatigued.

## 2021-04-22 ENCOUNTER — Telehealth: Payer: Self-pay | Admitting: *Deleted

## 2021-04-22 NOTE — Telephone Encounter (Signed)
Received a call  from the patient requesting a work leave of absence for at least 1 month. He states that he is so extremely tired. He has a Engineer, [employer] job and is afraid that he might make a mistake due to being so fatigued throughout the day.  He has never been seen by Dr Claiborne Billings. He just read his sleep study and made recommendations. I can't give him an appointment to be seen by Dr Claiborne Billings due to the schedule being out until November. His CPAP machine has been ordered, however due to the nationwide back order I am not sure how long it will take to receive it. His order has been pushed ahead of other patient's due to the severity of his OSA. Patient also informed that the fatigue and sleepiness that he is feeling will continue to be present until he starts his CPAP therapy. What are his plans if it's over 1 month before he can get a machine. He responds that he will definitely return to work before the month runs out if he starts to feel better. He just does not have enough time to be out and he cannot afford to lose his job. The patient was told that there is no guarantee that he will be given a leave of absence from work however, I will send a message to his primary cardiologist. Patient voiced understanding and will await a call from Dr [employer] nurse or myself. Message will be deferred to Dr Percival Spanish for review and recommendation.

## 2021-04-24 NOTE — Telephone Encounter (Signed)
Per Dr Percival Spanish okay to give patient work excuse. Patient notified.

## 2021-04-30 ENCOUNTER — Encounter: Payer: Self-pay | Admitting: *Deleted

## 2021-06-29 ENCOUNTER — Encounter: Payer: Self-pay | Admitting: Internal Medicine

## 2021-07-05 ENCOUNTER — Other Ambulatory Visit: Payer: Self-pay | Admitting: Internal Medicine

## 2021-07-23 ENCOUNTER — Other Ambulatory Visit: Payer: Self-pay

## 2021-07-23 ENCOUNTER — Encounter: Payer: Self-pay | Admitting: Cardiovascular Disease

## 2021-07-23 ENCOUNTER — Ambulatory Visit (INDEPENDENT_AMBULATORY_CARE_PROVIDER_SITE_OTHER): Payer: BC Managed Care – PPO | Admitting: Cardiovascular Disease

## 2021-07-23 DIAGNOSIS — R931 Abnormal findings on diagnostic imaging of heart and coronary circulation: Secondary | ICD-10-CM

## 2021-07-23 DIAGNOSIS — G4719 Other hypersomnia: Secondary | ICD-10-CM

## 2021-07-23 DIAGNOSIS — G4733 Obstructive sleep apnea (adult) (pediatric): Secondary | ICD-10-CM

## 2021-07-23 DIAGNOSIS — I10 Essential (primary) hypertension: Secondary | ICD-10-CM

## 2021-07-23 DIAGNOSIS — R0683 Snoring: Secondary | ICD-10-CM

## 2021-07-23 DIAGNOSIS — G4734 Idiopathic sleep related nonobstructive alveolar hypoventilation: Secondary | ICD-10-CM | POA: Diagnosis not present

## 2021-07-23 NOTE — Patient Instructions (Signed)
Medication Instructions:  Continue current medications. No changes.  *If you need a refill on your cardiac medications before your next appointment, please call your pharmacy*   Lab Work: None If you have labs (blood work) drawn today and your tests are completely normal, you will receive your results only by: [place] (if you have MyChart) OR A paper copy in the mail If you have any lab test that is abnormal or we need to change your treatment, we will call you to review the results.   Testing/Procedures: none   Follow-Up: At [facility], you and your health needs are our priority.  As part of our continuing mission to provide you with exceptional heart care, we have created designated Provider Care Teams.  These Care Teams include your primary Cardiologist (physician) and Advanced Practice Providers (APPs -  Physician Assistants and Nurse Practitioners) who all work together to provide you with the care you need, when you need it.  We recommend signing up for the patient portal called "MyChart".  Sign up information is provided on this After Visit Summary.  MyChart is used to connect with patients for Virtual Visits (Telemedicine).  Patients are able to view lab/test results, encounter notes, upcoming appointments, etc.  Non-urgent messages can be sent to your provider as well.   To learn more about what you can do with MyChart, go to [email or web address].    Your next appointment:   12 month(s)  The format for your next appointment:   In Person  Provider:   Dr. Corky Downs, MD

## 2021-07-23 NOTE — Progress Notes (Signed)
Cardiology Office Note    Date:  08/06/2021   ID:  Edward [name], DOB [birth year], MRN [id number]  PCP:  [name], MD  Cardiologist:  Shelva Majestic, MD (sleep); Dr. Percival Spanish  New sleep evaluation following initiation of CPAP therapy   History of Present Illness:  Edward [name] is a 55 y.o. male who is followed by Dr. Percival Spanish for primary care.  The patient has a history of hypertension and also has experience of episodes of chest pain.  He had been followed by cardiologist in [place] but had switched care.  When seen by Dr. Percival Spanish, he was on valsartan HCT 160/12.5 mg daily for hypertension, atorvastatin 40 mg daily for hyperlipidemia, and was on Pepcid and omeprazole for GERD.  During his initial evaluation, the patient admitted to significant snoring.  Sleepiness and snoring was elevated.  He ultimately was referred for a home sleep study on March 26, 2021.  He was found to have very severe overall sleep apnea with an AHI of 67.8/h.  He had severe oxygen desaturation to nadir of 63% and time spent less than 89% was 223.5 minutes.  Snoring was noted.  Apparently he was not approved for an in lab titration study and initiated AutoPap therapy from April 24, 2021.  His initial pressure settings were a minimum pressure of 7 with maximum of 20.  A download was obtained from October 18 through July 22, 2021.  He is meeting compliance standards and had only missed 1 night of usage.  Average usage was approximately 8 hours per night (7 hours and 59 minutes).  There were some nights with moderate mask leak and some nights without leak.  A threshold is 24 L/min and his 95th percentile average was 36.8 L/min.  His 95th percentile pressure was 11.3 with maximum average pressure 12.6.  AHI is still slightly elevated at 5.8 cm of water.  Since initiating CPAP, he feels significantly improved.  His sleep is more restorative and Epworth Sleepiness Scale score was calculated in the office  today and this endorsed at 10 still reflective of some mild residual daytime sleepiness.  With therapy, he believes his snoring has completely resolved.  Previously had significant nocturia and also GERD nocturnally but these have markedly improved with treatment.  His nocturia is now less than 1 time per night compared to previously at 3 times per night.  He presents for his initial sleep evaluation.  Past Medical History:  Diagnosis Date   Anxiety    Barrett's esophagus    GERD (gastroesophageal reflux disease)    Hyperlipidemia    Hypertension     No past surgical history on file.  Current Medications: Outpatient Medications Prior to Visit  Medication Sig Dispense Refill   ALPRAZolam (XANAX) 0.5 MG tablet Take 0.5 mg by mouth 3 (three) times daily as needed.     amLODipine (NORVASC) 2.5 MG tablet Take 1 tablet (2.5 mg total) by mouth at bedtime. 90 tablet 3   atorvastatin (LIPITOR) 80 MG tablet Take 1 tablet (80 mg total) by mouth daily. 90 tablet 3   famotidine (PEPCID) 20 MG tablet Take 1 tablet (20 mg total) by mouth at bedtime. 90 tablet 3   omeprazole (PRILOSEC) 40 MG capsule TAKE 1 CAPSULE BY MOUTH EVERY DAY 90 capsule 0   valsartan-hydrochlorothiazide (DIOVAN HCT) 160-12.5 MG tablet Take 1 tablet by mouth daily. 90 tablet 3   No facility-administered medications prior to visit.     Allergies:   Patient has no  known allergies.   Social History   Socioeconomic History   Marital status: Married    Spouse name: Not on file   Number of children: Not on file   Years of education: Not on file   Highest education level: Not on file  Occupational History   Not on file  Tobacco Use   Smoking status: Never   Smokeless tobacco: Never  Substance and Sexual Activity   Alcohol use: Yes    Alcohol/week: 4.0 standard drinks    Types: 4 Glasses of wine per week    Comment: 4 drinks  a day   Drug use: Never   Sexual activity: Not on file  Other Topics Concern   Not on file   Social History Narrative   Married, two children, three grands   Social Determinants of Health   Financial Resource Strain: Not on file  Food Insecurity: Not on file  Transportation Needs: Not on file  Physical Activity: Not on file  Stress: Not on file  Social Connections: Not on file     Social he was born in [place].  He is married and has 2 children.  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    Family History:  The patient's family history includes Cancer in his paternal grandmother; Colon polyps in his mother; Diabetes in his mother; Heart attack in his father and paternal grandfather; Heart disease in his father and another family member; Multiple sclerosis in his mother; Stroke in his father.  His father died secondary to CVA and had a pacemaker.  Mother is deceased.  He had an uncle who had undergone CABG revascularization.  ROS General: Negative; No fevers, chills, or night sweats;  HEENT: Negative; No changes in vision or hearing, sinus congestion, difficulty swallowing Pulmonary: Negative; No cough, wheezing, shortness of breath, hemoptysis Cardiovascular: Positive for hypertension, atypical chest pain  GI: Negative; No nausea, vomiting, diarrhea, or abdominal pain GU: Negative; No dysuria, hematuria, or difficulty voiding Musculoskeletal: Negative; no myalgias, joint pain, or weakness Hematologic/Oncology: Negative; no easy bruising, bleeding Endocrine: Negative; no heat/cold intolerance; no diabetes Neuro: Negative; no changes in balance, headaches Skin: Negative; No rashes or skin lesions Psychiatric: Negative; No behavioral problems, depression Sleep:See HPI; previous snoring, daytime sleepiness, nocturia 3 times per night,.  No restless legs, hypnagogic hallucinations, bruxism or cataplectic events.  An Epworth Sleepiness Scale score was recalculated in the office today and this endorsed at 10 as shown below.  Epworth  Sleepiness Scale: Situation   Chance of Dozing/Sleeping (0 = never , 1 = slight chance , 2 = moderate chance , 3 = high chance )   sitting and reading 2   watching TV 2   sitting inactive in a public place 0   being a passenger in a motor vehicle for an hour or more 2   lying down in the afternoon 2   sitting and talking to someone 0   sitting quietly after lunch (no alcohol) 2   while stopped for a few minutes in traffic as the driver 0   Total Score  10     Other comprehensive 14 point system review is negative.   PHYSICAL EXAM:   VS:  BP (!) 142/88   Pulse 84   Ht '5\' 11"'  (1.803 m)   Wt 234 lb 12.8 oz (106.5 kg)   SpO2 95%   BMI 32.75 kg/m     Repeat blood pressure by me  was 132/84  Wt Readings from Last 3 Encounters:  07/23/21 234 lb 12.8 oz (106.5 kg)  02/12/21 221 lb 9.6 oz (100.5 kg)  11/10/20 220 lb 9.6 oz (100.1 kg)    General: Alert, oriented, no distress.  Skin: normal turgor, no rashes, warm and dry HEENT: Normocephalic, atraumatic. Pupils equal round and reactive to light; sclera anicteric; extraocular muscles intact;  Nose without nasal septal hypertrophy Mouth/Parynx benign; Mallinpatti scale 3 Neck: No JVD, no carotid bruits; normal carotid upstroke Lungs: clear to ausculatation and percussion; no wheezing or rales Chest wall: without tenderness to palpitation Heart: PMI not displaced, RRR, s1 s2 normal, 1/6 systolic murmur, no diastolic murmur, no rubs, gallops, thrills, or heaves Abdomen: soft, nontender; no hepatosplenomehaly, BS+; abdominal aorta nontender and not dilated by palpation. Back: no CVA tenderness Pulses 2+ Musculoskeletal: full range of motion, normal strength, no joint deformities Extremities: no clubbing cyanosis or edema, Homan's sign negative  Neurologic: grossly nonfocal; Cranial nerves grossly wnl Psychologic: Normal mood and affect   Studies/Labs Reviewed:   EKG:  EKG is ordered today.  ECG (independently read by me):  NSR at  84, Early transition, no ectopy  Recent Labs: No flowsheet data found.   Hepatic Function Latest Ref Rng & Units 11/10/2020  Total Protein 6.0 - 8.5 g/dL 7.3  Albumin 3.8 - 4.9 g/dL 4.9  AST 0 - 40 IU/L 32  ALT 0 - 44 IU/L 46(H)  Alk Phosphatase 44 - 121 IU/L 88  Total Bilirubin 0.0 - 1.2 mg/dL 0.6  Bilirubin, Direct 0.00 - 0.40 mg/dL 0.16    CBC Latest Ref Rng & Units 11/10/2020  WBC 3.4 - 10.8 x10E3/uL 6.9  Hemoglobin 13.0 - 17.7 g/dL 17.4  Hematocrit 37.5 - 51.0 % 51.0  Platelets 150 - 450 x10E3/uL 208   Lab Results  Component Value Date   MCV 95 11/10/2020   No results found for: TSH No results found for: HGBA1C   BNP No results found for: BNP  ProBNP No results found for: PROBNP   Lipid Panel     Component Value Date/Time   CHOL 198 11/10/2020 1132   TRIG 132 11/10/2020 1132   HDL 73 11/10/2020 1132   CHOLHDL 2.7 11/10/2020 1132   LDLCALC 102 (H) 11/10/2020 1132   LABVLDL 23 11/10/2020 1132     RADIOLOGY: No results found.   Additional studies/ records that were reviewed today include:     03/26/2021 CLINICAL INFORMATION Sleep Study Type: HST   Indication for sleep study: snoring, daytime sleepiness, difficult to control HTN   Epworth Sleepiness Score: elevated   SLEEP STUDY TECHNIQUE A multi-channel overnight portable sleep study was performed. The channels recorded were: nasal airflow, thoracic respiratory movement, and oxygen saturation with a pulse oximetry. Snoring was also monitored.   MEDICATIONS ALPRAZolam (XANAX) 0.5 MG tablet amLODipine (NORVASC) 2.5 MG tablet atorvastatin (LIPITOR) 80 MG tablet famotidine (PEPCID) 20 MG tablet omeprazole (PRILOSEC) 40 MG capsule valsartan-hydrochlorothiazide (DIOVAN HCT) 160-12.5 MG tablet  Patient self administered medications include: N/A.   SLEEP ARCHITECTURE Patient was studied for 399.9 minutes. The sleep efficiency was 66.7 % and the patient was supine for 66.2%. The arousal index was 0.0  per hour.   RESPIRATORY PARAMETERS The overall AHI was 67.8 per hour, with a central apnea index of 0 per hour.   The oxygen nadir was 63% during sleep.   CARDIAC DATA Mean heart rate during sleep was 63.3 bpm.   IMPRESSIONS - Severe obstructive sleep apnea occurred during  this study (AHI 67.8/h). - Severe oxygen desaturation to a nadir of 63%. Time spent < 89% was 223.5 minutes. - Patient snored 7.9% during the sleep.   DIAGNOSIS - Obstructive Sleep Apnea (G47.33) - Nocturnal Hypoxemia (G47.36)   RECOMMENDATIONS - In this patient with cardiovascular comorbidities and very severe sleep apnea with marked oxygen desaturation to 63%, recommend an in-lab CPAP/BiPAP titratin study. If unable to schedule then initiate Auto-PAP with EPR of 3 at 7 - 20 cm of water. - Effort should be made to optimize nasal and oropharyngeal patency. - Avoid alcohol, sedatives and other CNS depressants that may worsen sleep apnea and disrupt normal sleep architecture. - Sleep hygiene should be reviewed to assess factors that may improve sleep quality. - Weight management and regular exercise should be initiated or continued. - Recommend a download and sleep clinic evaluation ofter one month of therapy.     ASSESSMENT:    1. OSA (obstructive sleep apnea)   2. Nocturnal hypoxemia   3. Snoring   4. Essential hypertension   5. Excessive daytime sleepiness   6. Elevated coronary artery calcium score     PLAN:  Edward [name] is a very-year-old gentleman with a history of hypertension ultimately had experienced an atypical chest pain had a normal stress test.  He had symptoms of sleep apnea including snoring, daytime sleepiness, awakening gasping for breath, non-restorative sleep, and nocturia 3 times per night. He underwent a home sleep study which revealed severe sleep apnea with an AHI of 67.8 events per hour with very severe oxygen desaturation to nadir of 63%.  We discussed potential risk for  nocturnal ischemia and time spent less than 89% was 223.5 minutes.  He did not undergo an in lab CPAP titration and ultimately was started on AutoPap therapy  with set up date on April 23, 2021.  [facility] is his DME Company.  He has been using a medium size F 30i mask.  He goes to bed at 10 PM and wakes up at 7:30 AM.  Average use was 8 hours and 15 minutes in his pressure range of 7 to 20 cm had a 95th percentile pressure 11.4.  AHI was 5.2/h.  Subsequent download from October 18 July 22, 2021 similar data with his 95th percentile pressure 11.3 and AHI of 5.8.  No significant benefit since initiating CPAP therapy with resolution of prior snoring, marked improvement in his origin of sleep, and reduction in nocturia to less than 1 time per night on average has noticed improvement in daytime sleepiness.  An Epworth scale score today is still borderline increased to 10.  I had a lengthy discussion with him today and discussed sleep architecture and also the effects of untreated sleep apnea creating disruptive architecture.  I discussed the effects of untreated sleep apnea with reference to blood pressure control, potential for nocturnal arrhythmias, palpitations and increased risk for atrial fibrillation, as well as effects on sugar, inflammation, as well as GERD.  He had marked nocturnal hypoxemia to a nadir of 63% on his home study and his marked hypoxemia can contribute to potential nocturnal ischemia both cardiac as well as cerebrovascular.  I discussed with him at length the pathophysiology associated with increased nocturia associated with sleep apnea and he now has noted marked improvement.  We discussed optimal sleep duration at 7 to 9 hours in an adult.  He is meeting compliance standards with therapy.  In light of his current download, I will make mild adjustment  to his CPAP pressure and will change him to a range of 10 to 18 cm of water rather than 7 to 20 cm of water.  I will also increase  his ramp start pressure to 7 from 4 cm of water.  He is doing well with his mask.  We discussed optimal cleaning.  His blood pressure today is well controlled and on repeat by me was 130/84 on current therapy with valsartan HCT 160/12.5 mg and amlodipine 2.5 mg at bedtime.  He is on atorvastatin 80 mg for hyperlipidemia and a recent coronary calcium score was 85 placing him in the 73rd percentile for age, race and sex matched control.  He has seen a psychologist for some anxiety and depression and takes alprazolam on an as-needed basis.  He will follow-up with Dr. Percival Spanish.  I will see him in 1 year for follow-up evaluation or sooner as needed.  Time spent: 40 minutes  Medication Adjustments/Labs and Tests Ordered: Current medicines are reviewed at length with the patient today.  Concerns regarding medicines are outlined above.  Medication changes, Labs and Tests ordered today are listed in the Patient Instructions below. Patient Instructions  Medication Instructions:  Continue current medications. No changes.  *If you need a refill on your cardiac medications before your next appointment, please call your pharmacy*   Lab Work: None If you have labs (blood work) drawn today and your tests are completely normal, you will receive your results only by: [place] (if you have MyChart) OR A paper copy in the mail If you have any lab test that is abnormal or we need to change your treatment, we will call you to review the results.   Testing/Procedures: none   Follow-Up: At [facility], you and your health needs are our priority.  As part of our continuing mission to provide you with exceptional heart care, we have created designated Provider Care Teams.  These Care Teams include your primary Cardiologist (physician) and Advanced Practice Providers (APPs -  Physician Assistants and Nurse Practitioners) who all work together to provide you with the care you need, when you need it.  We  recommend signing up for the patient portal called "MyChart".  Sign up information is provided on this After Visit Summary.  MyChart is used to connect with patients for Virtual Visits (Telemedicine).  Patients are able to view lab/test results, encounter notes, upcoming appointments, etc.  Non-urgent messages can be sent to your provider as well.   To learn more about what you can do with MyChart, go to [email or web address].    Your next appointment:   12 month(s)  The format for your next appointment:   In Person  Provider:   Dr. Corky Downs, MD      Signed, Shelva Majestic, MD  08/06/2021 2:29 PM    [facility] [address] [place], [place], [place]  [address] Phone: [phone]

## 2021-07-27 ENCOUNTER — Other Ambulatory Visit: Payer: Self-pay

## 2021-07-27 MED ORDER — VALSARTAN-HYDROCHLOROTHIAZIDE 160-12.5 MG PO TABS: 1.0000 | ORAL_TABLET | Freq: Every day | ORAL | 3 refills | Status: DC

## 2021-08-06 ENCOUNTER — Encounter: Payer: Self-pay | Admitting: Cardiovascular Disease

## 2021-08-14 ENCOUNTER — Ambulatory Visit (AMBULATORY_SURGERY_CENTER): Payer: BC Managed Care – PPO

## 2021-08-14 ENCOUNTER — Other Ambulatory Visit: Payer: Self-pay

## 2021-08-14 VITALS — Ht 71.0 in | Wt 225.0 lb

## 2021-08-14 DIAGNOSIS — K2270 Barrett's esophagus without dysplasia: Secondary | ICD-10-CM

## 2021-08-14 DIAGNOSIS — K219 Gastro-esophageal reflux disease without esophagitis: Secondary | ICD-10-CM

## 2021-08-14 DIAGNOSIS — Z1211 Encounter for screening for malignant neoplasm of colon: Secondary | ICD-10-CM

## 2021-08-14 MED ORDER — NA SULFATE-K SULFATE-MG SULF 17.5-3.13-1.6 GM/177ML PO SOLN: 1.0000 | Freq: Once | ORAL | 0 refills | Status: AC

## 2021-08-14 NOTE — Progress Notes (Signed)
    Patient's pre-visit was done today over the phone with the patient   Name,DOB and address verified.   Patient denies any allergies to Eggs and Soy.  Patient denies any problems with anesthesia/sedation.-Has never had surgery-- only sedation with procedures. Patient denies taking diet pills or blood thinners.  Denies atrial flutter or atrial fib Denies chronic constipation No home Oxygen.   Packet of Prep instructions mailed to patient including a copy of a consent form-pt is aware.  Patient understands to call us back with any questions or concerns.  Patient is aware of our care-partner policy and [id number] safety protocol.

## 2021-08-28 ENCOUNTER — Encounter: Payer: Self-pay | Admitting: Internal Medicine

## 2021-08-28 ENCOUNTER — Ambulatory Visit (AMBULATORY_SURGERY_CENTER): Payer: BC Managed Care – PPO | Admitting: Internal Medicine

## 2021-08-28 VITALS — BP 116/79 | HR 75 | Temp 97.1°F | Resp 21 | Ht 71.0 in | Wt 225.0 lb

## 2021-08-28 DIAGNOSIS — K2270 Barrett's esophagus without dysplasia: Secondary | ICD-10-CM

## 2021-08-28 DIAGNOSIS — K2970 Gastritis, unspecified, without bleeding: Secondary | ICD-10-CM | POA: Diagnosis not present

## 2021-08-28 DIAGNOSIS — K635 Polyp of colon: Secondary | ICD-10-CM

## 2021-08-28 DIAGNOSIS — D123 Benign neoplasm of transverse colon: Secondary | ICD-10-CM

## 2021-08-28 DIAGNOSIS — K219 Gastro-esophageal reflux disease without esophagitis: Secondary | ICD-10-CM | POA: Diagnosis not present

## 2021-08-28 DIAGNOSIS — K2100 Gastro-esophageal reflux disease with esophagitis, without bleeding: Secondary | ICD-10-CM

## 2021-08-28 DIAGNOSIS — Z1211 Encounter for screening for malignant neoplasm of colon: Secondary | ICD-10-CM

## 2021-08-28 DIAGNOSIS — K2950 Unspecified chronic gastritis without bleeding: Secondary | ICD-10-CM

## 2021-08-28 MED ORDER — SODIUM CHLORIDE 0.9 % IV SOLN: 500.0000 mL | Freq: Once | INTRAVENOUS | Status: DC

## 2021-08-28 NOTE — Op Note (Signed)
[facility] Patient Name: Edward [name] Procedure Date: 08/28/2021 7:45 AM MRN: [id number] Endoscopist: Jerene Bears , MD Age: 55 Referring MD:  Date of Birth: 11-24-1965 Gender: Male Account #: [id number] Procedure:                Upper GI endoscopy Indications:              Follow-up of Barrett's esophagus Medicines:                Monitored Anesthesia Care Procedure:                Pre-Anesthesia Assessment:                           - Prior to the procedure, a History and Physical                            was performed, and patient medications and                            allergies were reviewed. The patient's tolerance of                            previous anesthesia was also reviewed. The risks                            and benefits of the procedure and the sedation                            options and risks were discussed with the patient.                            All questions were answered, and informed consent                            was obtained. Prior Anticoagulants: The patient has                            taken no previous anticoagulant or antiplatelet                            agents. ASA Grade Assessment: II - A patient with                            mild systemic disease. After reviewing the risks                            and benefits, the patient was deemed in                            satisfactory condition to undergo the procedure.                           After obtaining informed consent, the endoscope was  passed under direct vision. Throughout the                            procedure, the patient's blood pressure, pulse, and                            oxygen saturations were monitored continuously. The                            GIF [id number] was introduced through the                            mouth, and advanced to the second part of duodenum.                            The upper GI endoscopy  was accomplished without                            difficulty. The patient tolerated the procedure                            well. Scope In: Scope Out: Findings:                 The esophagus and gastroesophageal junction were                            examined with white light and narrow band imaging                            (NBI) from a forward view and retroflexed position.                            There were esophageal mucosal changes suggestive of                            short-segment Barrett's esophagus. These changes                            involved the mucosa at the upper extent of the                            gastric folds (34 cm from the incisors) extending                            to the Z-line (32 cm from the incisors).                            Circumferential salmon-colored mucosa was present                            from 33 to 34 cm and squamous islands were present  from 32 to 33 cm. The maximum longitudinal extent                            of these esophageal mucosal changes was 3 cm in                            length. Mucosa was biopsied with a cold forceps for                            histology in a targeted manner at intervals of 2                            cm. A total of 2 specimen bottles were sent to                            pathology.                           A 5 cm hiatal hernia was present.                           The gastroesophageal flap valve was visualized                            endoscopically and classified as Hill Grade IV (no                            fold, wide open lumen, hiatal hernia present).                           Diffuse mildly erythematous mucosa was found in the                            gastric body and in the gastric antrum. Biopsies                            were taken with a cold forceps for histology and                            Helicobacter pylori testing.                            The examined duodenum was normal. Complications:            No immediate complications. Estimated Blood Loss:     Estimated blood loss was minimal. Impression:               - Esophageal mucosal changes suggestive of                            short-segment Barrett's esophagus. Biopsied.                           - 5 cm hiatal hernia.                           -  Erythematous mucosa in the gastric body and                            antrum. Biopsied.                           - Normal examined duodenum. Recommendation:           - Patient has a contact number available for                            emergencies. The signs and symptoms of potential                            delayed complications were discussed with the                            patient. Return to normal activities tomorrow.                            Written discharge instructions were provided to the                            patient.                           - Resume previous diet.                           - Continue present medications including omeprazole                            40 mg daily.                           - Await pathology results.                           - Repeat upper endoscopy for surveillance based on                            pathology results. Jerene Bears, MD 08/28/2021 8:43:53 AM This report has been signed electronically.

## 2021-08-28 NOTE — Progress Notes (Signed)
GASTROENTEROLOGY PROCEDURE H&P NOTE   Primary Care Physician: Edward Neu, MD    Reason for Procedure:  History of Barrett's esophagus and colon cancer screening  Plan:    EGD and colonoscopy  Patient is appropriate for endoscopic procedure(s) in the ambulatory ([place]) setting.  The nature of the procedure, as well as the risks, benefits, and alternatives were carefully and thoroughly reviewed with the patient. Ample time for discussion and questions allowed. The patient understood, was satisfied, and agreed to proceed.     HPI: Edward [name] is a 55 y.o. male who presents for EGD and colonoscopy.  Tolerated the prep.  No recent chest pain or shortness of breath.  No abdominal pain today.  He has noticed more gas and bloating in the last 4 to 6 weeks.  He started a probiotic 3 months ago.  No change in bowel habits since that time.  Past Medical History:  Diagnosis Date   A-pap    Anxiety    Barrett's esophagus    GERD (gastroesophageal reflux disease)    Hyperlipidemia    Hypertension     History reviewed. No pertinent surgical history.  Prior to Admission medications   Medication Sig Start Date End Date Taking? Authorizing Provider  ALPRAZolam Edward [name]) 0.5 MG tablet Take 0.5 mg by mouth 3 (three) times daily as needed. 07/11/20  Yes [provider]  Ascorbic Acid (VITAMIN C PO) Take by mouth.   Yes [provider]  famotidine (PEPCID) 20 MG tablet Take 1 tablet (20 mg total) by mouth at bedtime. 07/18/20  Yes Edward [name], Edward Lines, MD  Omega-3 Fatty Acids (FISH OIL PO) Take by mouth.   Yes [provider]  omeprazole (PRILOSEC) 40 MG capsule TAKE 1 CAPSULE BY MOUTH EVERY DAY 07/06/21  Yes Edward [name], Edward Lines, MD  Probiotic Product (PROBIOTIC PO) Take by mouth.   Yes [provider]  valsartan-hydrochlorothiazide (DIOVAN HCT) 160-12.5 MG tablet Take 1 tablet by mouth daily. 07/27/21  Yes Edward Breeding, MD  VITAMIN D PO Take by mouth.    Yes [provider]  amLODipine (NORVASC) 2.5 MG tablet Take 1 tablet (2.5 mg total) by mouth at bedtime. 02/12/21 08/14/21  Edward Breeding, MD  atorvastatin (LIPITOR) 80 MG tablet Take 1 tablet (80 mg total) by mouth daily. 02/16/21 08/14/21  Edward Breeding, MD    Current Outpatient Medications  Medication Sig Dispense Refill   ALPRAZolam (XANAX) 0.5 MG tablet Take 0.5 mg by mouth 3 (three) times daily as needed.     Ascorbic Acid (VITAMIN C PO) Take by mouth.     famotidine (PEPCID) 20 MG tablet Take 1 tablet (20 mg total) by mouth at bedtime. 90 tablet 3   Omega-3 Fatty Acids (FISH OIL PO) Take by mouth.     omeprazole (PRILOSEC) 40 MG capsule TAKE 1 CAPSULE BY MOUTH EVERY DAY 90 capsule 0   Probiotic Product (PROBIOTIC PO) Take by mouth.     valsartan-hydrochlorothiazide (DIOVAN HCT) 160-12.5 MG tablet Take 1 tablet by mouth daily. 90 tablet 3   VITAMIN D PO Take by mouth.     amLODipine (NORVASC) 2.5 MG tablet Take 1 tablet (2.5 mg total) by mouth at bedtime. 90 tablet 3   atorvastatin (LIPITOR) 80 MG tablet Take 1 tablet (80 mg total) by mouth daily. 90 tablet 3   Current Facility-Administered Medications  Medication Dose Route Frequency Provider Last Rate Last Admin   0.9 %  sodium chloride infusion  500 mL Intravenous Once  Edward Bears, MD        Allergies as of 08/28/2021   (No Known Allergies)    Family History  Problem Relation Age of Onset   Colon polyps Mother    Diabetes Mother    Multiple sclerosis Mother    Heart disease Father        Pacemaker   Heart attack Father    Stroke Father    Cancer Paternal Grandmother        ?   Heart attack Paternal Grandfather    Heart disease Other    Colon cancer Neg Hx    Esophageal cancer Neg Hx    Stomach cancer Neg Hx    Rectal cancer Neg Hx     Social History   Socioeconomic History   Marital status: Married    Spouse name: Not on file   Number of children: Not on file   Years of education: Not on file    Highest education level: Not on file  Occupational History   Not on file  Tobacco Use   Smoking status: Never   Smokeless tobacco: Never  Vaping Use   Vaping Use: Never used  Substance and Sexual Activity   Alcohol use: Yes    Alcohol/week: 4.0 standard drinks    Types: 4 Glasses of wine per week    Comment: 4 drinks  a day   Drug use: Never   Sexual activity: Not on file  Other Topics Concern   Not on file  Social History Narrative   Married, two children, three grands   Social Determinants of Health   Financial Resource Strain: Not on file  Food Insecurity: Not on file  Transportation Needs: Not on file  Physical Activity: Not on file  Stress: Not on file  Social Connections: Not on file  Intimate Partner Violence: Not on file    Physical Exam: Vital signs in last 24 hours: @BP  (!) 144/103    Pulse 73    Temp (!) 97.1 F (36.2 C)    Resp 13    Ht 5\' 11"  (1.803 m)    Wt 225 lb (102.1 kg)    SpO2 100%    BMI 31.38 kg/m  GEN: NAD EYE: Sclerae anicteric ENT: MMM CV: Non-tachycardic Pulm: CTA b/l GI: Soft, NT/ND NEURO:  Alert & Oriented x 3   Edward Jarred, MD [facility]  08/28/2021 8:13 AM

## 2021-08-28 NOTE — Progress Notes (Signed)
Called to room to assist during endoscopic procedure.  Patient ID and intended procedure confirmed with present staff. Received instructions for my participation in the procedure from the performing physician.  

## 2021-08-28 NOTE — Progress Notes (Signed)
Pt in recovery with monitors in place, VSS. Report given to receiving RN. Bite guard was placed with pt awake to ensure comfort. No dental or soft tissue damage noted. 

## 2021-08-28 NOTE — Progress Notes (Signed)
VS-CW  Pt's states no medical or surgical changes since previsit or office visit.  

## 2021-08-28 NOTE — Op Note (Signed)
[facility] Patient Name: Edward [name] Procedure Date: 08/28/2021 7:05 AM MRN: [id number] Endoscopist: Jerene Bears , MD Age: 55 Referring MD:  Date of Birth: 04-Nov-1965 Gender: Male Account #: [id number] Procedure:                Colonoscopy Indications:              Screening for colorectal malignant neoplasm, Last                            colonoscopy 10 years ago Medicines:                Monitored Anesthesia Care Procedure:                Pre-Anesthesia Assessment:                           - Prior to the procedure, a History and Physical                            was performed, and patient medications and                            allergies were reviewed. The patient's tolerance of                            previous anesthesia was also reviewed. The risks                            and benefits of the procedure and the sedation                            options and risks were discussed with the patient.                            All questions were answered, and informed consent                            was obtained. Prior Anticoagulants: The patient has                            taken no previous anticoagulant or antiplatelet                            agents. ASA Grade Assessment: II - A patient with                            mild systemic disease. After reviewing the risks                            and benefits, the patient was deemed in                            satisfactory condition to undergo the procedure.  After obtaining informed consent, the colonoscope                            was passed under direct vision. Throughout the                            procedure, the patient's blood pressure, pulse, and                            oxygen saturations were monitored continuously. The                            Colonoscope was introduced through the anus and                            advanced to the terminal ileum. The  colonoscopy was                            performed without difficulty. The patient tolerated                            the procedure well. The quality of the bowel                            preparation was good. The ileocecal valve,                            appendiceal orifice, and rectum were photographed. Scope In: 8:27:01 AM Scope Out: 8:38:12 AM Scope Withdrawal Time: 0 hours 9 minutes 49 seconds  Total Procedure Duration: 0 hours 11 minutes 11 seconds  Findings:                 The digital rectal exam was normal.                           The terminal ileum appeared normal.                           A 5 mm polyp was found in the transverse colon. The                            polyp was sessile. The polyp was removed with a                            cold snare. Resection and retrieval were complete.                           Multiple small and large-mouthed diverticula were                            found in the sigmoid colon, descending colon,                            transverse colon and ascending colon.  Internal hemorrhoids were found during                            retroflexion. The hemorrhoids were small. Complications:            No immediate complications. Estimated Blood Loss:     Estimated blood loss: none. Impression:               - The examined portion of the ileum was normal.                           - One 5 mm polyp in the transverse colon, removed                            with a cold snare. Resected and retrieved.                           - Moderate diverticulosis in the sigmoid colon, in                            the descending colon, in the transverse colon and                            in the ascending colon.                           - Small internal hemorrhoids. Recommendation:           - Patient has a contact number available for                            emergencies. The signs and symptoms of potential                             delayed complications were discussed with the                            patient. Return to normal activities tomorrow.                            Written discharge instructions were provided to the                            patient.                           - Resume previous diet.                           - Continue present medications.                           - Await pathology results.                           - Stop probiotic to see if abdominal gas and  bloating symptom improves. If not contact my office.                           - Repeat colonoscopy is recommended. The                            colonoscopy date will be determined after pathology                            results from today's exam become available for                            review. Jerene Bears, MD 08/28/2021 8:48:18 AM This report has been signed electronically.

## 2021-08-28 NOTE — Patient Instructions (Signed)
Handouts on Polyps, Diverticulosis, and Barrett's given to patient.  A prescription for Omeprazole 40 mg was sent to your pharmacy, please take as directed.  Please stop probiotic.  YOU HAD AN ENDOSCOPIC PROCEDURE TODAY AT [facility]:   Refer to the procedure report that was given to you for any specific questions about what was found during the examination.  If the procedure report does not answer your questions, please call your gastroenterologist to clarify.  If you requested that your care partner not be given the details of your procedure findings, then the procedure report has been included in a sealed envelope for you to review at your convenience later.  YOU SHOULD EXPECT: Some feelings of bloating in the abdomen. Passage of more gas than usual.  Walking can help get rid of the air that was put into your GI tract during the procedure and reduce the bloating. If you had a lower endoscopy (such as a colonoscopy or flexible sigmoidoscopy) you may notice spotting of blood in your stool or on the toilet paper. If you underwent a bowel prep for your procedure, you may not have a normal bowel movement for a few days.  Please Note:  You might notice some irritation and congestion in your nose or some drainage.  This is from the oxygen used during your procedure.  There is no need for concern and it should clear up in a day or so.  SYMPTOMS TO REPORT IMMEDIATELY:  Following lower endoscopy (colonoscopy or flexible sigmoidoscopy):  Excessive amounts of blood in the stool  Significant tenderness or worsening of abdominal pains  Swelling of the abdomen that is new, acute  Fever of 100F or higher  Following upper endoscopy (EGD)  Vomiting of blood or coffee ground material  New chest pain or pain under the shoulder blades  Painful or persistently difficult swallowing  New shortness of breath  Fever of 100F or higher  Black, tarry-looking stools  For urgent or emergent  issues, a gastroenterologist can be reached at any hour by calling [phone]. Do not use MyChart messaging for urgent concerns.    DIET:  We do recommend a small meal at first, but then you may proceed to your regular diet.  Drink plenty of fluids but you should avoid alcoholic beverages for 24 hours.  ACTIVITY:  You should plan to take it easy for the rest of today and you should NOT DRIVE or use heavy machinery until tomorrow (because of the sedation medicines used during the test).    FOLLOW UP: Our staff will call the number listed on your records 48-72 hours following your procedure to check on you and address any questions or concerns that you may have regarding the information given to you following your procedure. If we do not reach you, we will leave a message.  We will attempt to reach you two times.  During this call, we will ask if you have developed any symptoms of COVID 19. If you develop any symptoms (ie: fever, flu-like symptoms, shortness of breath, cough etc.) before then, please call [phone].  If you test positive for Covid 19 in the 2 weeks post procedure, please call and report this information to [place].    If any biopsies were taken you will be contacted by phone or by letter within the next 1-3 weeks.  Please call us at [phone] if you have not heard about the biopsies in 3 weeks.    SIGNATURES/CONFIDENTIALITY: You and/or  your care partner have signed paperwork which will be entered into your electronic medical record.  These signatures attest to the fact that that the information above on your After Visit Summary has been reviewed and is understood.  Full responsibility of the confidentiality of this discharge information lies with you and/or your care-partner.

## 2021-09-02 ENCOUNTER — Telehealth: Payer: Self-pay

## 2021-09-02 NOTE — Telephone Encounter (Signed)
Left message on follow up call. 

## 2021-09-02 NOTE — Telephone Encounter (Signed)
2nd attempt at follow up phone call placed, non-identified VM obtained, no message left. SChaplin, RN,BSN

## 2021-09-08 ENCOUNTER — Other Ambulatory Visit: Payer: Self-pay

## 2021-09-08 ENCOUNTER — Telehealth: Payer: BC Managed Care – PPO | Admitting: Internal Medicine

## 2021-09-08 MED ORDER — RIFAXIMIN 550 MG PO TABS: 550.0000 mg | ORAL_TABLET | Freq: Three times a day (TID) | ORAL | 0 refills | Status: DC

## 2021-09-08 NOTE — Telephone Encounter (Signed)
Patient called stating that he is having issues since he has had his colon procedure on 12/23, states that he is having abd pain, bloating and gas. Seeking advice, please advise.

## 2021-09-08 NOTE — Telephone Encounter (Signed)
Called and spoke with pt. Pt states he has been off of probiotic since procedure but is still experiencing abdominal pain, gas, and bloating. Pt states that abdominal pain comes and goes and ranges in severity from mild to severe. Pt also states he has been taking mas strength gas x which he notices helps. He has also been taking pepto bismol. He said he has diarrhea that comes and goes and has about 3-4 BMs per day. Pt states that stool was watery yesterday but was solid today. Pt also states that he had a very dark stool this morning. Please advise.

## 2021-09-08 NOTE — Telephone Encounter (Signed)
Called number patient gave. Medication was approved. Case reference number is [id number]. Called and spoke with pt to let him know medication was approved. Pt verbalized understanding.

## 2021-09-08 NOTE — Telephone Encounter (Signed)
Please let patient know results of his recent upper endoscopy and colonoscopy: 1.  EGD revealed Barrett's esophagus without dysplasia; will need to recall EGD in 3 years 2.  Mild gastritis in the stomach without H. Pylori 3.  Colonic polyp was benign and not precancerous; will need a recall colonoscopy in 10 years  Given his symptoms I would recommend we treat for IBS-D with rifaximin 550 mg 3 times daily x14 days He should wait about 2 weeks after completing this antibiotic and then let me know if symptoms persist; I would expect this to improve his abdominal discomfort, bloating and loose stools  Please let me know if he has any further questions Once he has been notified of the pathology results and recalls placed no pathology letter is needed

## 2021-09-08 NOTE — Telephone Encounter (Signed)
Inbound call from patient states his insurance, [employer], can not give the medication to him for 5-7 days unless there is a call from the nurse stating this is an urgent medication for him to take. Phone number [phone]

## 2021-09-08 NOTE — Telephone Encounter (Signed)
Called and spoke with pt. Gave pt results and recommendations. Pt verbalized understanding. Rifaximin sent to pt's pharmacy.

## 2021-09-10 NOTE — Telephone Encounter (Signed)
Pt called back and said that his insurance covered some of xifaxan but medication would still cost him over $1,000. Pt wanted to know if an alternative medication could be used.

## 2021-09-10 NOTE — Telephone Encounter (Signed)
Mickel Baas Can you see if this medication was sent to [facility] --they often can help with prior authorizations especially for commercially insured patients such as this Please ensure that the rifaximin 550 mg 3 times daily x14 days is being submitted for IBS with diarrhea If it was already sent to the specialty pharmacy let me know and we can consider other options Please let the patient what we are trying to do to get the med approved for him Please keep me posted Thanks JMP

## 2021-09-11 ENCOUNTER — Other Ambulatory Visit: Payer: Self-pay

## 2021-09-11 ENCOUNTER — Telehealth: Payer: Self-pay | Admitting: Internal Medicine

## 2021-09-11 MED ORDER — RIFAXIMIN 550 MG PO TABS: 550.0000 mg | ORAL_TABLET | Freq: Three times a day (TID) | ORAL | 0 refills | Status: DC

## 2021-09-11 NOTE — Telephone Encounter (Signed)
Inbound call from patient, stated that he had spoke with you this morning. He stated that you may have to call his insurance and let them know what was going on with his Xifaxan medication. Please advise.

## 2021-09-11 NOTE — Telephone Encounter (Signed)
Returned patient's call and let him know that medication was just sent to [facility] and they will complete prior authorization process. Told pt he could contact [facility] but to give them a few days since prescription was just sent. Pt verbalized understanding.

## 2021-09-11 NOTE — Telephone Encounter (Signed)
Rifaximin 550 mg 3 times daily x 14 days for IBS-D sent to [facility] pharmacy. Also faxed patient's records to [facility]. Spoke with pt to let him know prescription was sent to [facility]. Pt verbalized understanding.

## 2021-09-16 NOTE — Telephone Encounter (Signed)
Inbound call from patient requesting to speak with a nurse please.  Has additional questions in regards to medication.

## 2021-09-17 NOTE — Telephone Encounter (Signed)
Called and spoke with pt. Pt states he is feeling much better and his pain is a 1 to 2/10. Pt reports feeling better for the past 5 days and stomach pains are nearly gone. Pt stated that he did experience some mild lower abd pain when he recently had a very active day but feels like in general he is improving. Pt stated that he spoke with [facility] and xifaxan was still more than he could afford. Pt reports he has been having mostly solid stool and feels like he doesn't need the medication anymore. Advised pt to call us back if symptoms return.

## 2021-10-06 ENCOUNTER — Encounter: Payer: Self-pay | Admitting: Behavioral Health

## 2021-10-06 ENCOUNTER — Ambulatory Visit (INDEPENDENT_AMBULATORY_CARE_PROVIDER_SITE_OTHER): Payer: BC Managed Care – PPO | Admitting: Behavioral Health

## 2021-10-06 ENCOUNTER — Other Ambulatory Visit: Payer: Self-pay

## 2021-10-06 VITALS — BP 119/84 | HR 90 | Ht 71.0 in | Wt 235.0 lb

## 2021-10-06 DIAGNOSIS — F331 Major depressive disorder, recurrent, moderate: Secondary | ICD-10-CM

## 2021-10-06 DIAGNOSIS — F411 Generalized anxiety disorder: Secondary | ICD-10-CM | POA: Diagnosis not present

## 2021-10-06 MED ORDER — HYDROXYZINE HCL 25 MG PO TABS: 25.0000 mg | ORAL_TABLET | Freq: Two times a day (BID) | ORAL | 0 refills | Status: DC

## 2021-10-06 MED ORDER — SERTRALINE HCL 50 MG PO TABS: ORAL_TABLET | ORAL | 1 refills | Status: DC

## 2021-10-06 NOTE — Progress Notes (Signed)
[facility] MD/PA/NP Initial Note  10/06/2021 5:14 PM Edward [name]  MRN:  [id number]  Chief Complaint:  Chief Complaint   Depression; Anxiety; Medication Problem; Medication Refill; Establish Care     HPI:  56 year old male presents to this office for initial visit and to establish care.  He had recent Neuropsychological evaluation 11/22 with Dr. Sheppard Evens. His wife is present during interview with his consent. Collateral information is reliable. He says that he has struggled with anxiety and depression since he was young. Says it runs in his family but he does not understand any know reasons or triggers.  He has tried a couple of antidepressants in the past but he normally did not take for more than a few days because he say they made him want to sleep all the time. Says he has relied of taking xanax the last 15 years to help him control his anxiety. He feels like his depression is not existent or mild but his wife disagrees. His PHQ-9 was positive for moderate depression and GAD7 also positive for moderate-severe anxiety. MDQ was negative.  He says that he is here today to consider alternative medication to treat his A&D but is apprehensive about what to do about the xanax. He reports anxiety today on numerical scale 3/10 and depression 2/10, but usually would be higher on other days. He said he is sleeping 7-8 hours sleep per night but it is broken. He denies mania, no psychosis, no SI/HI.  Prior psychiatric medication list: Prozac   Visit Diagnosis:    ICD-10-CM   1. Generalized anxiety disorder  F41.1 hydrOXYzine (ATARAX) 25 MG tablet    sertraline (ZOLOFT) 50 MG tablet    2. Major depressive disorder, recurrent episode, moderate (HCC)  F33.1 sertraline (ZOLOFT) 50 MG tablet      Past Psychiatric History: severe anxiety, MDD  Past Medical History:  Past Medical History:  Diagnosis Date   A-pap    Anxiety    Barrett's esophagus    GERD (gastroesophageal reflux disease)     Hyperlipidemia    Hypertension    History reviewed. No pertinent surgical history.  Family Psychiatric History: see chart  Family History:  Family History  Problem Relation Age of Onset   Depression Mother    Anxiety disorder Mother    Colon polyps Mother    Diabetes Mother    Multiple sclerosis Mother    Depression Father    Anxiety disorder Father    Heart disease Father        Pacemaker   Heart attack Father    Stroke Father    Anxiety disorder Sister    Heart attack Paternal Grandfather    Anxiety disorder Paternal Grandmother    Cancer Paternal Grandmother        ?   Heart disease Other    Colon cancer Neg Hx    Esophageal cancer Neg Hx    Stomach cancer Neg Hx    Rectal cancer Neg Hx     Social History:  Social History   Socioeconomic History   Marital status: Married    Spouse name: Levada Dy   Number of children: 2   Years of education: 12   Highest education level: High school graduate  Occupational History   Occupation: Retired  Tobacco Use   Smoking status: Never   Smokeless tobacco: Never  Vaping Use   Vaping Use: Never used  Substance and Sexual Activity   Alcohol use: Yes    Alcohol/week: 4.0  standard drinks    Types: 4 Glasses of wine per week    Comment: 4 drinks  a day   Drug use: Never   Sexual activity: Yes  Other Topics Concern   Not on file  Social History Narrative   Married, two children, three grands. Lives in [place].   Social Determinants of Health   Financial Resource Strain: Not on file  Food Insecurity: Not on file  Transportation Needs: Not on file  Physical Activity: Not on file  Stress: Not on file  Social Connections: Not on file    Allergies: No Known Allergies  Metabolic Disorder Labs: No results found for: HGBA1C, MPG No results found for: PROLACTIN Lab Results  Component Value Date   CHOL 198 11/10/2020   TRIG 132 11/10/2020   HDL 73 11/10/2020   CHOLHDL 2.7 11/10/2020   LDLCALC 102 (H)  11/10/2020   No results found for: TSH  Therapeutic Level Labs: No results found for: LITHIUM No results found for: VALPROATE No components found for:  CBMZ  Current Medications: Current Outpatient Medications  Medication Sig Dispense Refill   ALPRAZolam (XANAX) 0.5 MG tablet Take 0.5 mg by mouth 3 (three) times daily as needed.     Ascorbic Acid (VITAMIN C PO) Take by mouth.     famotidine (PEPCID) 20 MG tablet Take 1 tablet (20 mg total) by mouth at bedtime. 90 tablet 3   hydrOXYzine (ATARAX) 25 MG tablet Take 1 tablet (25 mg total) by mouth 2 (two) times daily. 60 tablet 0   Omega-3 Fatty Acids (FISH OIL PO) Take by mouth.     omeprazole (PRILOSEC) 40 MG capsule TAKE 1 CAPSULE BY MOUTH EVERY DAY 90 capsule 0   sertraline (ZOLOFT) 50 MG tablet Take 1/2 tablet 25 mg for 7 days, then take one whole tablet 50 mg total daily. 30 tablet 1   valsartan-hydrochlorothiazide (DIOVAN HCT) 160-12.5 MG tablet Take 1 tablet by mouth daily. 90 tablet 3   VITAMIN D PO Take by mouth.     amLODipine (NORVASC) 2.5 MG tablet Take 1 tablet (2.5 mg total) by mouth at bedtime. 90 tablet 3   atorvastatin (LIPITOR) 80 MG tablet Take 1 tablet (80 mg total) by mouth daily. 90 tablet 3   Probiotic Product (PROBIOTIC PO) Take by mouth. (Patient not taking: Reported on 10/06/2021)     rifaximin (XIFAXAN) 550 MG TABS tablet Take 1 tablet (550 mg total) by mouth 3 (three) times daily. (Patient not taking: Reported on 10/06/2021) 42 tablet 0   No current facility-administered medications for this visit.    Medication Side Effects: none  Orders placed this visit:  No orders of the defined types were placed in this encounter.   Psychiatric Specialty Exam:  Review of Systems  Musculoskeletal:  Positive for back pain.  Neurological:  Positive for dizziness, weakness and numbness.   Blood pressure 119/84, pulse 90, height 5\' 11"  (1.803 m), weight 235 lb (106.6 kg).Body mass index is 32.78 kg/m.  General  Appearance: Casual, Neat, and Well Groomed  Eye Contact:  Good  Speech:  Clear and Coherent  Volume:  Normal  Mood:  Anxious and Depressed  Affect:  Depressed and Anxious  Thought Process:  Coherent  Orientation:  Full (Time, Place, and Person)  Thought Content: Logical   Suicidal Thoughts:  No  Homicidal Thoughts:  No  Memory:  WNL  Judgement:  Good  Insight:  Good  Psychomotor Activity:  Normal  Concentration:  Concentration: Good  Recall:  Good  Fund of Knowledge: Good  Language: Good  Assets:  Desire for Improvement  ADL's:  Intact  Cognition: WNL  Prognosis:  Good   Screenings:  GAD-7    Flowsheet Row Office Visit from 10/06/2021 in [facility]  Total GAD-7 Score 13      PHQ2-9    [place] Office Visit from 10/06/2021 in [facility]  PHQ-2 Total Score 4  PHQ-9 Total Score 15       Receiving Psychotherapy: No   Treatment Plan/Recommendations:   Greater than 50% of  60 min face to face time with patient was spent on counseling and coordination of care. We discussed his long hx of anxiety and depression with anxiety being dominate. We discussed his past tx and goals for treatment here. I was very candid in discussing my recommendation the need to wean off his xanax which he has been using over 15 years. His recent neuropsychological evaluation suggested the same.  I feel like it is one of the problems causing mental fogginess, alertness, and memory problems prematurely. He has lost job to reported poor performance due to these issue. He agreed that he is open to weaning slowly off the xanax and being committed to finding a medication that would be more appropriate in treating long term anxiety. I explained that the process would be slow and that I did not expect him to go cold [place].   We agreed today to:  To start Zoloft 25 mg for 7 days, then 50 mg daily To Start Hydroxyzine 25 mg twice daily as needed. Instructed pt to  experiment with replacing dose of xanax with hydroxyzine to see if he gets benefit. Will report back next visit. Will report side effects or worsening symptoms promptly To follow up in 4 weeks to reassess progress Provided emergency contact information Discussed potential benefits, risk, and side effects of benzodiazepines to include potential risk of tolerance and dependence, as well as possible drowsiness.  Advised patient not to drive if experiencing drowsiness and to take lowest possible effective dose to minimize risk of dependence and tolerance.  Reviewed PDMP     Elwanda Brooklyn, NP

## 2021-10-07 ENCOUNTER — Telehealth: Payer: Self-pay | Admitting: Internal Medicine

## 2021-10-07 ENCOUNTER — Other Ambulatory Visit: Payer: Self-pay

## 2021-10-07 MED ORDER — METRONIDAZOLE 250 MG PO TABS: 250.0000 mg | ORAL_TABLET | Freq: Four times a day (QID) | ORAL | 0 refills | Status: DC

## 2021-10-07 MED ORDER — CIPROFLOXACIN HCL 500 MG PO TABS: 500.0000 mg | ORAL_TABLET | Freq: Two times a day (BID) | ORAL | 0 refills | Status: DC

## 2021-10-07 NOTE — Telephone Encounter (Signed)
Patient called and stated that his pharmacy called and said they had a  Xifaxan proscription ready for him to pick up. Patient states that he did not know that Dr. Hilarie Fredrickson put him on the medication and is seeking advice to know what it is for. Please advise.

## 2021-10-07 NOTE — Telephone Encounter (Signed)
Returned pt's call. Pt stated he found out from pharmacy that xifaxan prescription is now covered completely. Pt wanted to make sure xifaxan was what Dr. Hilarie Fredrickson prescribed for his IBS. Let pt know that was the medication that Dr. Hilarie Fredrickson ordered. Pt feels like he doesn't need medication and let pharmacy know. Pt now reports pain in lower abd 2-3 inches below belt buckle and that pain feels different than last time. Pt states that pain last time was all over abd and pt had severe gas which he feels like has subsided. The new pain in lower abd can be severe at times. Pt states he has had no changes in bowel habits, pt has diarrhea occasionally which pt reports is typical.  Please advise.

## 2021-10-07 NOTE — Telephone Encounter (Signed)
This could possibly represent diverticulitis given that this is a new different pain.  He certainly has diverticulosis making diverticulitis possible Would treat with Cipro 500 mg twice daily x7 days and metronidazole 250 mg x 7 days He should let me know if pain does not completely improve

## 2021-10-07 NOTE — Telephone Encounter (Signed)
Gave pt recommendations and sent prescriptions to pt's pharmacy. Pt verbalized understanding and knows to contact office if pain does not completely improve.

## 2021-10-10 ENCOUNTER — Other Ambulatory Visit: Payer: Self-pay | Admitting: Internal Medicine

## 2021-10-19 ENCOUNTER — Encounter (HOSPITAL_BASED_OUTPATIENT_CLINIC_OR_DEPARTMENT_OTHER): Payer: Self-pay

## 2021-10-19 ENCOUNTER — Emergency Department (HOSPITAL_BASED_OUTPATIENT_CLINIC_OR_DEPARTMENT_OTHER): Payer: BC Managed Care – PPO

## 2021-10-19 ENCOUNTER — Other Ambulatory Visit: Payer: Self-pay

## 2021-10-19 ENCOUNTER — Emergency Department (HOSPITAL_BASED_OUTPATIENT_CLINIC_OR_DEPARTMENT_OTHER)
Admission: EM | Admit: 2021-10-19 | Discharge: 2021-10-19 | Disposition: A | Discharge status: Home / Self Care | Payer: BC Managed Care – PPO | Attending: Emergency Medicine | Admitting: Emergency Medicine

## 2021-10-19 DIAGNOSIS — R1031 Right lower quadrant pain: Secondary | ICD-10-CM | POA: Diagnosis present

## 2021-10-19 DIAGNOSIS — R1032 Left lower quadrant pain: Secondary | ICD-10-CM | POA: Insufficient documentation

## 2021-10-19 DIAGNOSIS — Z79899 Other long term (current) drug therapy: Secondary | ICD-10-CM | POA: Insufficient documentation

## 2021-10-19 DIAGNOSIS — U071 COVID-19: Secondary | ICD-10-CM | POA: Diagnosis not present

## 2021-10-19 DIAGNOSIS — R109 Unspecified abdominal pain: Secondary | ICD-10-CM

## 2021-10-19 LAB — COMPREHENSIVE METABOLIC PANEL
ALT: 76 U/L — ABNORMAL HIGH (ref 0–44)
AST: 71 U/L — ABNORMAL HIGH (ref 15–41)
Albumin: 4.9 g/dL (ref 3.5–5.0)
Alkaline Phosphatase: 75 U/L (ref 38–126)
Anion gap: 15 (ref 5–15)
BUN: 13 mg/dL (ref 6–20)
CO2: 21 mmol/L — ABNORMAL LOW (ref 22–32)
Calcium: 9.9 mg/dL (ref 8.9–10.3)
Chloride: 102 mmol/L (ref 98–111)
Creatinine, Ser: 0.78 mg/dL (ref 0.61–1.24)
GFR, Estimated: >60 mL/min (ref >60)
Glucose, Bld: 96 mg/dL (ref 70–99)
Potassium: 3.9 mmol/L (ref 3.5–5.1)
Sodium: 138 mmol/L (ref 135–145)
Total Bilirubin: 0.5 mg/dL (ref 0.3–1.2)
Total Protein: 7.9 g/dL (ref 6.5–8.1)

## 2021-10-19 LAB — CBC
HCT: 48.1 % (ref 39.0–52.0)
Hemoglobin: 16.2 g/dL (ref 13.0–17.0)
MCH: 31.5 pg (ref 26.0–34.0)
MCHC: 33.7 g/dL (ref 30.0–36.0)
MCV: 93.6 fL (ref 80.0–100.0)
Platelets: 205 10*3/uL (ref 150–400)
RBC: 5.14 MIL/uL (ref 4.22–5.81)
RDW: 12.7 % (ref 11.5–15.5)
WBC: 8.1 10*3/uL (ref 4.0–10.5)
nRBC: 0 % (ref 0.0–0.2)

## 2021-10-19 LAB — URINALYSIS, ROUTINE W REFLEX MICROSCOPIC
Bilirubin Urine: NEGATIVE
Glucose, UA: NEGATIVE mg/dL
Leukocytes,Ua: NEGATIVE
Nitrite: NEGATIVE
Protein, ur: NEGATIVE mg/dL
Specific Gravity, Urine: 1.012 (ref 1.005–1.030)
pH: 5 (ref 5.0–8.0)

## 2021-10-19 LAB — LIPASE, BLOOD: Lipase: 48 U/L (ref 11–51)

## 2021-10-19 MED ORDER — IOHEXOL 300 MG/ML  SOLN
100.0000 mL | Freq: Once | INTRAMUSCULAR | Status: AC | PRN
  Administered 2021-10-19: 100 mL via INTRAVENOUS

## 2021-10-19 NOTE — ED Provider Notes (Signed)
[facility] EMERGENCY DEPT Provider Note   CSN: [id number] Arrival date & time: 10/19/21  2000     History  Chief Complaint  Patient presents with   Abdominal Pain    Edward [name] is a 56 y.o. male.  The history is provided by the patient.  Abdominal Pain Pain location:  LLQ, RLQ and suprapubic Pain quality: aching   Pain radiates to:  Does not radiate Pain severity:  Mild Onset quality:  Gradual Duration:  10 days Timing:  Constant Progression:  Unchanged Chronicity:  New Context: not sick contacts   Relieved by:  Nothing Worsened by:  Nothing Associated symptoms: no anorexia, no belching, no chest pain, no chills, no constipation, no cough, no diarrhea, no dysuria, no melena, no nausea, no shortness of breath, no sore throat and no vomiting   Risk factors comment:  Dx with covid today, abdominal pain for 10 days     Home Medications Prior to Admission medications   Medication Sig Start Date End Date Taking? Authorizing Provider  ALPRAZolam Duanne Moron) 0.5 MG tablet Take 0.5 mg by mouth 3 (three) times daily as needed. 07/11/20   [provider]  amLODipine (NORVASC) 2.5 MG tablet Take 1 tablet (2.5 mg total) by mouth at bedtime. 02/12/21 08/14/21  Minus Breeding, MD  Ascorbic Acid (VITAMIN C PO) Take by mouth.    [provider]  atorvastatin (LIPITOR) 80 MG tablet Take 1 tablet (80 mg total) by mouth daily. 02/16/21 08/14/21  Minus Breeding, MD  ciprofloxacin (CIPRO) 500 MG tablet Take 1 tablet (500 mg total) by mouth 2 (two) times daily. 10/07/21   Pyrtle, Lajuan Lines, MD  famotidine (PEPCID) 20 MG tablet Take 1 tablet (20 mg total) by mouth at bedtime. 07/18/20   Pyrtle, Lajuan Lines, MD  hydrOXYzine (ATARAX) 25 MG tablet Take 1 tablet (25 mg total) by mouth 2 (two) times daily. 10/06/21   Elwanda Brooklyn, NP  metroNIDAZOLE (FLAGYL) 250 MG tablet Take 1 tablet (250 mg total) by mouth 4 (four) times daily. 10/07/21   Pyrtle, Lajuan Lines, MD  Omega-3 Fatty Acids  (FISH OIL PO) Take by mouth.    [provider]  omeprazole (PRILOSEC) 40 MG capsule TAKE 1 CAPSULE BY MOUTH EVERY DAY 10/12/21   Pyrtle, Lajuan Lines, MD  Probiotic Product (PROBIOTIC PO) Take by mouth. Patient not taking: Reported on 10/06/2021    [provider]  sertraline (ZOLOFT) 50 MG tablet Take 1/2 tablet 25 mg for 7 days, then take one whole tablet 50 mg total daily. 10/06/21   Elwanda Brooklyn, NP  valsartan-hydrochlorothiazide (DIOVAN HCT) 160-12.5 MG tablet Take 1 tablet by mouth daily. 07/27/21   Minus Breeding, MD  VITAMIN D PO Take by mouth.    [provider]      Allergies    Patient has no known allergies.    Review of Systems   Review of Systems  Constitutional:  Negative for chills.  HENT:  Negative for sore throat.   Respiratory:  Negative for cough and shortness of breath.   Cardiovascular:  Negative for chest pain.  Gastrointestinal:  Positive for abdominal pain. Negative for anorexia, constipation, diarrhea, melena, nausea and vomiting.  Genitourinary:  Negative for dysuria.   Physical Exam Updated Vital Signs BP (!) 130/91    Pulse 77    Temp 98.2 F (36.8 C) (Oral)    Resp 16    Ht 5\' 11"  (1.803 m)    Wt 104.3 kg  SpO2 98%    BMI 32.08 kg/m  Physical Exam Vitals and nursing note reviewed.  Constitutional:      General: He is not in acute distress.    Appearance: He is well-developed. He is not ill-appearing.  HENT:     Head: Normocephalic and atraumatic.     Mouth/Throat:     Mouth: Mucous membranes are moist.  Eyes:     Extraocular Movements: Extraocular movements intact.     Conjunctiva/sclera: Conjunctivae normal.  Cardiovascular:     Rate and Rhythm: Normal rate and regular rhythm.     Heart sounds: Normal heart sounds. No murmur heard. Pulmonary:     Effort: Pulmonary effort is normal. No respiratory distress.     Breath sounds: Normal breath sounds.  Abdominal:     General: There is no distension.     Palpations: Abdomen  is soft.     Tenderness: There is abdominal tenderness in the right lower quadrant, suprapubic area and left lower quadrant.  Musculoskeletal:        General: No swelling.     Cervical back: Neck supple.  Skin:    General: Skin is warm and dry.     Capillary Refill: Capillary refill takes less than 2 seconds.  Neurological:     General: No focal deficit present.     Mental Status: He is alert.  Psychiatric:        Mood and Affect: Mood normal.    ED Results / Procedures / Treatments   Labs (all labs ordered are listed, but only abnormal results are displayed) Labs Reviewed  COMPREHENSIVE METABOLIC PANEL - Abnormal; Notable for the following components:      Result Value   CO2 21 (*)    AST 71 (*)    ALT 76 (*)    All other components within normal limits  URINALYSIS, ROUTINE W REFLEX MICROSCOPIC - Abnormal; Notable for the following components:   Hgb urine dipstick TRACE (*)    Ketones, ur TRACE (*)    All other components within normal limits  LIPASE, BLOOD  CBC    EKG None  Radiology CT ABDOMEN PELVIS W CONTRAST  Result Date: 10/19/2021 CLINICAL DATA:  Left lower quadrant pain EXAM: CT ABDOMEN AND PELVIS WITH CONTRAST TECHNIQUE: Multidetector CT imaging of the abdomen and pelvis was performed using the standard protocol following bolus administration of intravenous contrast. RADIATION DOSE REDUCTION: This exam was performed according to the departmental dose-optimization program which includes automated exposure control, adjustment of the mA and/or kV according to patient size and/or use of iterative reconstruction technique. CONTRAST:  128mL OMNIPAQUE IOHEXOL 300 MG/ML  SOLN COMPARISON:  None. FINDINGS: Lower chest: Moderate-sized hiatal hernia.  No acute abnormality. Hepatobiliary: Mild diffuse low-density throughout the liver compatible with fatty infiltration. Small low-density lesion adjacent to the falciform ligament measures 1.6 cm most compatible with small cyst.  Gallbladder unremarkable. Pancreas: No focal abnormality or ductal dilatation. Spleen: No focal abnormality.  Normal size. Adrenals/Urinary Tract: No adrenal abnormality. No focal renal abnormality. No stones or hydronephrosis. Urinary bladder is unremarkable. Stomach/Bowel: Scattered colonic diverticulosis. No active diverticulitis. Normal appendix. Stomach and small bowel decompressed, unremarkable. Vascular/Lymphatic: No evidence of aneurysm or adenopathy. Reproductive: No visible focal abnormality. Other: No free fluid or free air. Bilateral small inguinal hernias containing fat. Musculoskeletal: No acute bony abnormality. IMPRESSION: No acute findings. Moderate-sized hiatal hernia. Hepatic steatosis. Colonic diverticulosis. Electronically Signed   By: Rolm Baptise M.D.   On: 10/19/2021 22:30  Procedures Procedures    Medications Ordered in ED Medications  iohexol (OMNIPAQUE) 300 MG/ML solution 100 mL (100 mLs Intravenous Contrast Given 10/19/21 2204)    ED Course/ Medical Decision Making/ A&P                           Medical Decision Making Amount and/or Complexity of Data Reviewed Labs: ordered. Radiology: ordered.  Risk Prescription drug management.   Edward [name] is here with lower abdominal pain.  Diagnosed with COVID today.  However has lower abdominal pain for the last 10 days.  History of diverticulosis.  Denies any nausea, vomiting.  Denies any pain with urination.  Patient with unremarkable vitals.  No fever.  Patient with lower abdominal pain on exam.  Differential diagnosis includes UTI versus diverticulitis versus appendicitis versus less likely bowel obstruction.  Could be colitis.  This also could be musculoskeletal type pain as well.  Will get CBC, CMP, lipase, CT scan abdomen pelvis and reevaluate.  Overall he appears to be asymptomatic from COVID standpoint.  Mild congestion.  Lab work has been reviewed and interpreted.  No significant anemia, electrolyte abdomen,  kidney injury.  Gallbladder and liver enzymes within normal limits.  Lipase normal.  Doubt cholecystitis or pancreatitis.  Awaiting CT scan for further evaluation of other intra-abdominal processes.  Urinalysis negative for infection.  Per radiology report CT scan with no acute findings.  Overall suspect may be muscular process.  Recommend Tylenol and ibuprofen.  Follow-up with primary care doctor.  Discharged in good condition.  This chart was dictated using voice recognition software.  Despite best efforts to proofread,  errors can occur which can change the documentation meaning.         Final Clinical Impression(s) / ED Diagnoses Final diagnoses:  Abdominal pain, unspecified abdominal location    Rx / DC Orders ED Discharge Orders     None         Lennice Sites, DO 10/19/21 2238

## 2021-10-19 NOTE — ED Triage Notes (Signed)
Patient here POV from Home with ABD Pain.  Patient endorses going to UC today for ABD Pain. Pain is Lower ABD and Non-radiating. Present and worsening for 10 days.   Positive for COVID-19 today; no Symptoms.  No Nausea or Emesis. Diarrhea today. No Urinary Symptoms. History of Diverticulosis.  NAD Noted during Triage. A&Ox4. GCS 15. Ambualtory.

## 2021-10-19 NOTE — ED Notes (Signed)
Patient transported to CT via stretcher with tech

## 2021-10-20 ENCOUNTER — Telehealth: Payer: Self-pay | Admitting: Behavioral Health

## 2021-10-20 NOTE — Telephone Encounter (Signed)
Called patient. States he has been on Zoloft 50 mg x14 days. He is sleepy all the time, no energy, no appetite, and has lost 12 pounds. He shakes so badly that feeding himself is difficult. The Atarax makes him dizzy. He sounded SOB and I asked him about it. He said he has COVID and that is why his voice is quivering and he is SOB. He said he would rather feel the way he was before medications. He said there were no sx that were better with medications.

## 2021-10-20 NOTE — Telephone Encounter (Signed)
Pt lvm that he is having problems on the medicine that brian prescribed to him. Please call him at [phone]

## 2021-10-21 NOTE — Telephone Encounter (Signed)
Yes, agreed and he has hx of not giving medication full benefit before discontinuing. Thank you.

## 2021-10-21 NOTE — Telephone Encounter (Signed)
These are not common side effects and I would like for him to power through this if he can  because it has not been enough time. Please tell him he can try taking Zoloft at bedtime if it is making him sleepy and this may help. These effects should get better in another week or two. With hydroxyzine, have him try taking 1/2 tablet twice daily instead. We can discuss other medication for anxiety next visit.

## 2021-10-21 NOTE — Telephone Encounter (Signed)
Called patient with the recommendations. He would like to make an adjustment now and not wait until his March F/U. I explaind to him that he had only been on medication for about 2 weeks and that with him having COVID it might affect his response.

## 2021-10-22 NOTE — Telephone Encounter (Signed)
Called patient and let him know he needed to give the Zoloft more time. He said he took 1/2 tablet yesterday and his shakes were much better.

## 2021-10-22 NOTE — Telephone Encounter (Signed)
Noted, thank you

## 2021-10-28 ENCOUNTER — Other Ambulatory Visit: Payer: Self-pay | Admitting: Behavioral Health

## 2021-10-28 DIAGNOSIS — F411 Generalized anxiety disorder: Secondary | ICD-10-CM

## 2021-10-28 DIAGNOSIS — F331 Major depressive disorder, recurrent, moderate: Secondary | ICD-10-CM

## 2021-11-04 ENCOUNTER — Encounter: Payer: Self-pay | Admitting: Behavioral Health

## 2021-11-04 ENCOUNTER — Ambulatory Visit (INDEPENDENT_AMBULATORY_CARE_PROVIDER_SITE_OTHER): Payer: BC Managed Care – PPO | Admitting: Behavioral Health

## 2021-11-04 ENCOUNTER — Other Ambulatory Visit: Payer: Self-pay

## 2021-11-04 DIAGNOSIS — F411 Generalized anxiety disorder: Secondary | ICD-10-CM | POA: Diagnosis not present

## 2021-11-04 DIAGNOSIS — F331 Major depressive disorder, recurrent, moderate: Secondary | ICD-10-CM | POA: Diagnosis not present

## 2021-11-04 MED ORDER — ALPRAZOLAM 0.5 MG PO TABS: 0.5000 mg | ORAL_TABLET | Freq: Three times a day (TID) | ORAL | 1 refills | Status: DC | PRN

## 2021-11-04 NOTE — Progress Notes (Signed)
[facility] ? ?Patient ID: Edward [name],  ?MRN: [id number] ? ?PCP: [name], MD ? ?Date of Evaluation: 11/04/2021 ?Time spent:30 minutes ? ?Chief Complaint:  ?Chief Complaint   ?Anxiety; Depression; Follow-up; Medication Refill; Medication Problem ?  ? ? ?HISTORY/CURRENT STATUS: ?HPI ? ?56 year old male presents to this office for follow up and medication management.  He says that he gave Zoloft an honest shot for over one month but could not stop shaking.Said his fatigue increase and all he wanted to do is stay in his recliner. Says the medication increased his anxiety and has had bouts of frequent diarrhea that never subsided. He is still open to finding adequate medication to assist him with his chronic anxiety. He agrees to [employer] testing today.   He understands that we are attempting to reduce his Xanax over time.  He reports anxiety today on numerical scale 6/10 and depression 2/10, but usually would be higher on other days. He said he is sleeping 7-8 hours sleep per night but it is broken. He denies mania, no psychosis, no SI/HI. ?  ?Prior psychiatric medication list: ?Prozac ? ?Individual Medical History/ Review of Systems: Changes? :Yes  ? ?Allergies: Patient has no known allergies. ? ?Current Medications:  ?Current Outpatient Medications:  ?  ALPRAZolam (XANAX) 0.5 MG tablet, Take 1 tablet (0.5 mg total) by mouth 3 (three) times daily as needed., Disp: 90 tablet, Rfl: 1 ?  amLODipine (NORVASC) 2.5 MG tablet, Take 1 tablet (2.5 mg total) by mouth at bedtime., Disp: 90 tablet, Rfl: 3 ?  Ascorbic Acid (VITAMIN C PO), Take by mouth., Disp: , Rfl:  ?  atorvastatin (LIPITOR) 80 MG tablet, Take 1 tablet (80 mg total) by mouth daily., Disp: 90 tablet, Rfl: 3 ?  ciprofloxacin (CIPRO) 500 MG tablet, Take 1 tablet (500 mg total) by mouth 2 (two) times daily., Disp: 14 tablet, Rfl: 0 ?  famotidine (PEPCID) 20 MG tablet, Take 1 tablet (20 mg total) by mouth at bedtime., Disp: 90 tablet,  Rfl: 3 ?  hydrOXYzine (ATARAX) 25 MG tablet, Take 1 tablet (25 mg total) by mouth 2 (two) times daily., Disp: 60 tablet, Rfl: 0 ?  metroNIDAZOLE (FLAGYL) 250 MG tablet, Take 1 tablet (250 mg total) by mouth 4 (four) times daily., Disp: 28 tablet, Rfl: 0 ?  Omega-3 Fatty Acids (FISH OIL PO), Take by mouth., Disp: , Rfl:  ?  omeprazole (PRILOSEC) 40 MG capsule, TAKE 1 CAPSULE BY MOUTH EVERY DAY, Disp: 90 capsule, Rfl: 1 ?  Probiotic Product (PROBIOTIC PO), Take by mouth. (Patient not taking: Reported on 10/06/2021), Disp: , Rfl:  ?  valsartan-hydrochlorothiazide (DIOVAN HCT) 160-12.5 MG tablet, Take 1 tablet by mouth daily., Disp: 90 tablet, Rfl: 3 ?  VITAMIN D PO, Take by mouth., Disp: , Rfl:  ?Medication Side Effects: none ? ?Family Medical/ Social History: Changes? No ? ?MENTAL HEALTH EXAM: ? ?There were no vitals taken for this visit.There is no height or weight on file to calculate BMI.  ?General Appearance: Casual  ?Eye Contact:  Good  ?Speech:  Clear and Coherent  ?Volume:  Normal  ?Mood:  Anxious  ?Affect:  Anxious  ?Thought Process:  Coherent  ?Orientation:  Full (Time, Place, and Person)  ?Thought Content: Logical   ?Suicidal Thoughts:  No  ?Homicidal Thoughts:  No  ?Memory:  WNL  ?Judgement:  Good  ?Insight:  Good  ?Psychomotor Activity:  Normal  ?Concentration:  Concentration: Good  ?Recall:  Good  ?Fund of Knowledge:  Good  ?Language: Good  ?Assets:  Desire for Improvement  ?ADL's:  Intact  ?Cognition: WNL  ?Prognosis:  Good  ? ? ?DIAGNOSES:  ?  ICD-10-CM   ?1. Generalized anxiety disorder  F41.1 ALPRAZolam (XANAX) 0.5 MG tablet  ?  ?2. Major depressive disorder, recurrent episode, moderate (HCC)  F33.1   ?  ? ? ?Receiving Psychotherapy: No  ? ? ?RECOMMENDATIONS:  ? ? ?Greater than 50% of  30  min face to face time with patient was spent on counseling and coordination of care. Discussed his inability to continue Zoloft due to undesirable side effects to include tremors and increased anxiety for over one  month. He agreed that he is still open to weaning slowly off the xanax and being committed to finding a medication that would be more appropriate in treating long term anxiety. I explained that the process would be slow and that I did not expect him to go cold [place]. Completed packet for [employer] testing today and explained process. ?  ?We agreed today to: ?  ?To stop Zoloft. Pt reports having tremors and increased anxiety for over 1 month while taking Zoloft. He also reported frequent diarrhea. We did [facility] testing today and will wait for results before prescribing another medication.  ?Stop Hydroxyzine 25 mg twice daily as needed. Patient said it made him to sleepy and did not control anxiety.  ?Will report side effects or worsening symptoms promptly ?To follow up in 4 weeks to reassess progress ?Provided emergency contact information ?Discussed potential benefits, risk, and side effects of benzodiazepines to include potential risk of tolerance and dependence, as well as possible drowsiness.  Advised patient not to drive if experiencing drowsiness and to take lowest possible effective dose to minimize risk of dependence and tolerance.  ?Reviewed PDMP ? ? ? ? ? ?Elwanda Brooklyn, NP  ?

## 2021-11-18 ENCOUNTER — Encounter: Payer: Self-pay | Admitting: Behavioral Health

## 2021-11-24 ENCOUNTER — Other Ambulatory Visit: Payer: Self-pay | Admitting: Behavioral Health

## 2021-11-24 DIAGNOSIS — F331 Major depressive disorder, recurrent, moderate: Secondary | ICD-10-CM

## 2021-11-24 DIAGNOSIS — F411 Generalized anxiety disorder: Secondary | ICD-10-CM

## 2021-11-26 ENCOUNTER — Encounter: Payer: Self-pay | Admitting: Behavioral Health

## 2021-11-26 ENCOUNTER — Other Ambulatory Visit: Payer: Self-pay

## 2021-11-26 ENCOUNTER — Ambulatory Visit (INDEPENDENT_AMBULATORY_CARE_PROVIDER_SITE_OTHER): Payer: BC Managed Care – PPO | Admitting: Behavioral Health

## 2021-11-26 DIAGNOSIS — F331 Major depressive disorder, recurrent, moderate: Secondary | ICD-10-CM | POA: Diagnosis not present

## 2021-11-26 DIAGNOSIS — F411 Generalized anxiety disorder: Secondary | ICD-10-CM

## 2021-11-26 MED ORDER — PROPRANOLOL HCL 10 MG PO TABS: 10.0000 mg | ORAL_TABLET | Freq: Three times a day (TID) | ORAL | 1 refills | Status: DC

## 2021-11-26 MED ORDER — ALPRAZOLAM 0.5 MG PO TABS: 0.5000 mg | ORAL_TABLET | Freq: Three times a day (TID) | ORAL | 2 refills | Status: DC | PRN

## 2021-11-26 NOTE — Progress Notes (Signed)
[facility] ? ?Patient ID: Edward [name],  ?MRN: [id number] ? ?PCP: [name], MD ? ?Date of Evaluation: 11/26/2021 ?Time spent:30 minutes ? ?Chief Complaint:  ?Chief Complaint   ?Anxiety; Follow-up; Medication Refill; Medication Problem ?  ? ? ?HISTORY/CURRENT STATUS: ?HPI ?56 year old male presents to this office for follow up and medication management. He says he has been doing better since shaking went away after stopping Zoloft. Says that he would like to try therapy for awhile instead of trying another daily medication. He is still open to finding adequate medication to assist him with his chronic anxiety. Reviewed [employer] today.  He understands that we are attempting to reduce his Xanax over time.  He reports anxiety today on numerical scale 6/10 and depression 2/10, but usually would be higher on other days. He said he is sleeping 7-8 hours sleep per night but it is broken. He denies mania, no psychosis, no SI/HI. ?  ?Prior psychiatric medication list: ?Prozac ?  ? ? ? ? ?Individual Medical History/ Review of Systems: Changes? :No  ? ?Allergies: Patient has no known allergies. ? ?Current Medications:  ?Current Outpatient Medications:  ?  propranolol (INDERAL) 10 MG tablet, Take 1 tablet (10 mg total) by mouth 3 (three) times daily., Disp: 90 tablet, Rfl: 1 ?  ALPRAZolam (XANAX) 0.5 MG tablet, Take 1 tablet (0.5 mg total) by mouth 3 (three) times daily as needed., Disp: 90 tablet, Rfl: 2 ?  amLODipine (NORVASC) 2.5 MG tablet, Take 1 tablet (2.5 mg total) by mouth at bedtime., Disp: 90 tablet, Rfl: 3 ?  Ascorbic Acid (VITAMIN C PO), Take by mouth., Disp: , Rfl:  ?  atorvastatin (LIPITOR) 80 MG tablet, Take 1 tablet (80 mg total) by mouth daily., Disp: 90 tablet, Rfl: 3 ?  ciprofloxacin (CIPRO) 500 MG tablet, Take 1 tablet (500 mg total) by mouth 2 (two) times daily., Disp: 14 tablet, Rfl: 0 ?  famotidine (PEPCID) 20 MG tablet, Take 1 tablet (20 mg total) by mouth at bedtime.,  Disp: 90 tablet, Rfl: 3 ?  hydrOXYzine (ATARAX) 25 MG tablet, Take 1 tablet (25 mg total) by mouth 2 (two) times daily., Disp: 60 tablet, Rfl: 0 ?  metroNIDAZOLE (FLAGYL) 250 MG tablet, Take 1 tablet (250 mg total) by mouth 4 (four) times daily., Disp: 28 tablet, Rfl: 0 ?  Omega-3 Fatty Acids (FISH OIL PO), Take by mouth., Disp: , Rfl:  ?  omeprazole (PRILOSEC) 40 MG capsule, TAKE 1 CAPSULE BY MOUTH EVERY DAY, Disp: 90 capsule, Rfl: 1 ?  Probiotic Product (PROBIOTIC PO), Take by mouth. (Patient not taking: Reported on 10/06/2021), Disp: , Rfl:  ?  valsartan-hydrochlorothiazide (DIOVAN HCT) 160-12.5 MG tablet, Take 1 tablet by mouth daily., Disp: 90 tablet, Rfl: 3 ?  VITAMIN D PO, Take by mouth., Disp: , Rfl:  ?Medication Side Effects: none ? ?Family Medical/ Social History: Changes? No ? ?MENTAL HEALTH EXAM: ? ?There were no vitals taken for this visit.There is no height or weight on file to calculate BMI.  ?General Appearance: Casual  ?Eye Contact:  Good  ?Speech:  Clear and Coherent  ?Volume:  Normal  ?Mood:  Anxious  ?Affect:  Anxious  ?Thought Process:  Coherent  ?Orientation:  Full (Time, Place, and Person)  ?Thought Content: Logical   ?Suicidal Thoughts:  No  ?Homicidal Thoughts:  No  ?Memory:  WNL  ?Judgement:  Good  ?Insight:  Good  ?Psychomotor Activity:  Normal  ?Concentration:  Concentration: Good  ?Recall:  Good  ?  Fund of Knowledge: Good  ?Language: Good  ?Assets:  Desire for Improvement  ?ADL's:  Intact  ?Cognition: WNL  ?Prognosis:  Good  ? ? ?DIAGNOSES:  ?  ICD-10-CM   ?1. Major depressive disorder, recurrent episode, moderate (HCC)  F33.1   ?  ?2. Generalized anxiety disorder  F41.1 ALPRAZolam (XANAX) 0.5 MG tablet  ?  propranolol (INDERAL) 10 MG tablet  ?  ? ? ?Receiving Psychotherapy: No  ? ? ?RECOMMENDATIONS:  ? ?Greater than 50% of  30  min face to face time with patient was spent on counseling and coordination of care. Discussed his inability to continue Zoloft due to undesirable side effects to  include tremors and increased anxiety for over one month. He agreed that he is still open to weaning slowly off the xanax and being committed to finding a medication that would be more appropriate in treating long term anxiety. I explained that the process would be slow and that I did not expect him to go cold [place]. Reviewed the results of [facility] today.  ?  ?We agreed today to: ?  ?To start Inderal 10 mg tablet three times daily as needed.  ?Take on weekend first time at home. No driving until see how responds to medication.  ?Will report side effects or worsening symptoms promptly ?To follow up in 4 weeks to reassess progress ?Provided emergency contact information ?Discussed potential benefits, risk, and side effects of benzodiazepines to include potential risk of tolerance and dependence, as well as possible drowsiness.  Advised patient not to drive if experiencing drowsiness and to take lowest possible effective dose to minimize risk of dependence and tolerance.  ?Reviewed PDMP ?  ? ? ? ? ? ? ?Elwanda Brooklyn, NP  ?

## 2021-12-03 ENCOUNTER — Encounter: Payer: Self-pay | Admitting: Surgery

## 2021-12-03 ENCOUNTER — Ambulatory Visit (INDEPENDENT_AMBULATORY_CARE_PROVIDER_SITE_OTHER): Payer: BC Managed Care – PPO | Admitting: Surgery

## 2021-12-03 VITALS — BP 149/90 | HR 102 | Temp 97.9°F | Ht 71.0 in | Wt 233.6 lb

## 2021-12-03 DIAGNOSIS — K4020 Bilateral inguinal hernia, without obstruction or gangrene, not specified as recurrent: Secondary | ICD-10-CM | POA: Insufficient documentation

## 2021-12-03 HISTORY — DX: Bilateral inguinal hernia, without obstruction or gangrene, not specified as recurrent: K40.20

## 2021-12-03 NOTE — Patient Instructions (Addendum)
If you have any concern or questions, please feel free to call our office.  ? ?Inguinal Hernia, Adult ?An inguinal hernia is when fat or your intestines push through a weak spot in a muscle where your leg meets your lower belly (groin). This causes a bulge. This kind of hernia could also be: ?In your scrotum, if you are male. ?In folds of skin around your vagina, if you are male. ?There are three types of inguinal hernias: ?Hernias that can be pushed back into the belly (are reducible). This type rarely causes pain. ?Hernias that cannot be pushed back into the belly (are incarcerated). ?Hernias that cannot be pushed back into the belly and lose their blood supply (are strangulated). This type needs emergency surgery. ?What are the causes? ?This condition is caused by having a weak spot in the muscles or tissues in your groin. This develops over time. The hernia may poke through the weak spot when you strain your lower belly muscles all of a sudden, such as when you: ?Lift a heavy object. ?Strain to poop (have a bowel movement). Trouble pooping (constipation) can lead to straining. ?Cough. ?What increases the risk? ?This condition is more likely to develop in: ?Males. ?Pregnant females. ?People who: ?Are overweight. ?Work in jobs that require long periods of standing or heavy lifting. ?Have had an inguinal hernia before. ?Smoke or have lung disease. These factors can lead to long-term (chronic) coughing. ?What are the signs or symptoms? ?Symptoms may depend on the size of the hernia. Often, a small hernia has no symptoms. Symptoms of a larger hernia may include: ?A bulge in the groin area. This is easier to see when standing. You might not be able to see it when you are lying down. ?Pain or burning in the groin. This may get worse when you lift, strain, or cough. ?A dull ache or a feeling of pressure in the groin. ?An abnormal bulge in the scrotum, in males. ?Symptoms of a strangulated inguinal hernia may  include: ?A bulge in your groin that is very painful and tender to the touch. ?A bulge that turns red or purple. ?Fever, feeling like you may vomit (nausea), and vomiting. ?Not being able to poop or to pass gas. ?How is this treated? ?Treatment depends on the size of your hernia and whether you have symptoms. If you do not have symptoms, your doctor may have you watch your hernia carefully and have you come in for follow-up visits. If your hernia is large or if you have symptoms, you may need surgery to repair the hernia. ?Follow these instructions at home: ?Lifestyle ?Avoid lifting heavy objects. ?Avoid standing for long amounts of time. ?Do not smoke or use any products that contain nicotine or tobacco. If you need help quitting, ask your doctor. ?Stay at a healthy weight. ?Prevent trouble pooping ?You may need to take these actions to prevent or treat trouble pooping: ?Drink enough fluid to keep your pee (urine) pale yellow. ?Take over-the-counter or prescription medicines. ?Eat foods that are high in fiber. These include beans, whole grains, and fresh fruits and vegetables. ?Limit foods that are high in fat and sugar. These include fried or sweet foods. ?General instructions ?You may try to push your hernia back in place by very gently pressing on it when you are lying down. Do not try to push the bulge back in if it will not go in easily. ?Watch your hernia for any changes in shape, size, or color. Tell your doctor if  you see any changes. ?Take over-the-counter and prescription medicines only as told by your doctor. ?Keep all follow-up visits. ?Contact a doctor if: ?You have a fever or chills. ?You have new symptoms. ?Your symptoms get worse. ?Get help right away if: ?You have pain in your groin that gets worse all of a sudden. ?You have a bulge in your groin that: ?Gets bigger all of a sudden, and it does not get smaller after that. ?Turns red or purple. ?Is painful when you touch it. ?You are a male, and you  have: ?Sudden pain in your scrotum. ?A sudden change in the size of your scrotum. ?You cannot push the hernia back in place by very gently pressing on it when you are lying down. ?You feel like you may vomit, and that feeling does not go away. ?You keep vomiting. ?You have a fast heartbeat. ?You cannot poop or pass gas. ?These symptoms may be an emergency. Get help right away. Call your local emergency services (911 in the [place] ?Do not wait to see if the symptoms will go away. ?Do not drive yourself to the hospital. ?Summary ?An inguinal hernia is when fat or your intestines push through a weak spot in a muscle where your leg meets your lower belly (groin). This causes a bulge. ?If you do not have symptoms, you may not need treatment. If you have symptoms or a large hernia, you may need surgery. ?Avoid lifting heavy objects. Also, avoid standing for long amounts of time. ?Do not try to push the bulge back in if it will not go in easily. ?This information is not intended to replace advice given to you by your health care provider. Make sure you discuss any questions you have with your health care provider. ?Document Revised: 04/22/2020 Document Reviewed: 04/22/2020 ?Elsevier Patient Education ? [employer]. ? ?

## 2021-12-03 NOTE — Progress Notes (Signed)
Patient ID: Edward [name], male   DOB: 1966-02-28, 56 y.o.   MRN: [id number] ? ?Chief Complaint: Bilateral groin pain, x2 months ? ?History of Present Illness ?Edward [name] is a 56 y.o. male with roughly 44-monthhistory of bilateral groin pain, seemingly exacerbated by coughing, riding a lawnmower and bouncing.  He does very little heavy lifting to demonstrate the pain.  He did have increased pain while walking uphill.  It is mostly a discomfort, however he did have significant pain back in February when he had a presentation to this ED and CT scan evaluation.  The CT scan showed bilateral small inguinal hernias that with fat filled inguinal canals.  Today he is a good day his pain is 1 mostly a discomfort.  Mostly its bilateral were centered over the suprapubic area, feeling as though squeezing process when he tries to stretch himself out straight. ? ?Past Medical History ?Past Medical History:  ?Diagnosis Date  ? A-pap   ? Anxiety   ? Barrett's esophagus   ? GERD (gastroesophageal reflux disease)   ? Hyperlipidemia   ? Hypertension   ?  ? ? ?History reviewed. No pertinent surgical history. ? ?No Known Allergies ? ?Current Outpatient Medications  ?Medication Sig Dispense Refill  ? ALPRAZolam (XANAX) 0.5 MG tablet Take 1 tablet (0.5 mg total) by mouth 3 (three) times daily as needed. 90 tablet 2  ? amLODipine (NORVASC) 2.5 MG tablet Take 1 tablet (2.5 mg total) by mouth at bedtime. 90 tablet 3  ? Ascorbic Acid (VITAMIN C PO) Take by mouth.    ? atorvastatin (LIPITOR) 80 MG tablet Take 1 tablet (80 mg total) by mouth daily. 90 tablet 3  ? famotidine (PEPCID) 20 MG tablet Take 1 tablet (20 mg total) by mouth at bedtime. 90 tablet 3  ? gabapentin (NEURONTIN) 100 MG capsule Take 100 mg by mouth 3 (three) times daily.    ? Omega-3 Fatty Acids (FISH OIL PO) Take by mouth.    ? omeprazole (PRILOSEC) 40 MG capsule TAKE 1 CAPSULE BY MOUTH EVERY DAY 90 capsule 1  ? propranolol (INDERAL) 10 MG tablet Take 1 tablet (10 mg  total) by mouth 3 (three) times daily. 90 tablet 1  ? valsartan-hydrochlorothiazide (DIOVAN HCT) 160-12.5 MG tablet Take 1 tablet by mouth daily. 90 tablet 3  ? VITAMIN D PO Take by mouth.    ? ?No current facility-administered medications for this visit.  ? ? ?Family History ?Family History  ?Problem Relation Age of Onset  ? Depression Mother   ? Anxiety disorder Mother   ? Colon polyps Mother   ? Diabetes Mother   ? Multiple sclerosis Mother   ? Depression Father   ? Anxiety disorder Father   ? Heart disease Father   ?     Pacemaker  ? Heart attack Father   ? Stroke Father   ? Anxiety disorder Sister   ? Heart attack Paternal Grandfather   ? Anxiety disorder Paternal Grandmother   ? Cancer Paternal Grandmother   ?     ?  ? Heart disease Other   ? Colon cancer Neg Hx   ? Esophageal cancer Neg Hx   ? Stomach cancer Neg Hx   ? Rectal cancer Neg Hx   ?  ? ? ?Social History ?Social History  ? ?Tobacco Use  ? Smoking status: Never  ? Smokeless tobacco: Never  ?Vaping Use  ? Vaping Use: Never used  ?Substance Use Topics  ? Alcohol use: Yes  ?  Alcohol/week: 4.0 standard drinks  ?  Types: 4 Glasses of wine per week  ?  Comment: 4 drinks  a day  ? Drug use: Never  ?  ?  ? ? ?Review of Systems  ?HENT:  Positive for tinnitus.   ?Gastrointestinal:  Positive for abdominal pain and diarrhea.  ?Neurological:  Positive for tingling (Peripheral neuropathy).  ?Psychiatric/Behavioral:  The patient is nervous/anxious.   ?All other systems reviewed and are negative. ?  ? ?Physical Exam ?Blood pressure (!) 149/90, pulse (!) 102, temperature 97.9 ?F (36.6 ?C), temperature source Oral, height '5\' 11"'$  (1.803 m), weight 233 lb 9.6 oz (106 kg), SpO2 97 %. ?Last Weight  Most recent update: 12/03/2021  2:19 PM  ? ? Weight  ?106 kg (233 lb 9.6 oz)  ?      ? ?  ? ? ?CONSTITUTIONAL: Well developed, and nourished, appropriately responsive and aware without distress.   ?EYES: Sclera non-icteric.   ?EARS, NOSE, MOUTH AND THROAT:  The oropharynx is  clear. Oral mucosa is pink and moist.    Hearing is intact to voice.  ?NECK: Trachea is midline, and there is no jugular venous distension.  ?LYMPH NODES:  Lymph nodes in the neck are not enlarged. ?RESPIRATORY:  Lungs are clear, and breath sounds are equal bilaterally. Normal respiratory effort without pathologic use of accessory muscles. ?CARDIOVASCULAR: Heart is regular in rate and rhythm. ?GI: The abdomen is soft, nontender, and nondistended.  ?GU: In lieu of exam, we reviewed CT imaging. ?MUSCULOSKELETAL:  Symmetrical muscle tone appreciated in all four extremities.    ?SKIN: Skin turgor is normal. No pathologic skin lesions appreciated.  ?NEUROLOGIC:  Motor and sensation appear grossly normal.  Cranial nerves are grossly without defect. ?PSYCH:  Alert and oriented to person, place and time. Affect is appropriate for situation. ? ?Data Reviewed ?I have personally reviewed what is currently available of the patient's imaging, recent labs and medical records.   ?Labs:  ? ?  Latest Ref Rng & Units 10/19/2021  ?  8:36 PM 11/10/2020  ? 11:32 AM  ?CBC  ?WBC 4.0 - 10.5 K/uL 8.1   6.9    ?Hemoglobin 13.0 - 17.0 g/dL 16.2   17.4    ?Hematocrit 39.0 - 52.0 % 48.1   51.0    ?Platelets 150 - 400 K/uL 205   208    ? ? ?  Latest Ref Rng & Units 10/19/2021  ?  8:36 PM 11/10/2020  ? 11:32 AM  ?CMP  ?Glucose 70 - 99 mg/dL 96     ?BUN 6 - 20 mg/dL 13     ?Creatinine 0.61 - 1.24 mg/dL 0.78     ?Sodium 135 - 145 mmol/L 138     ?Potassium 3.5 - 5.1 mmol/L 3.9     ?Chloride 98 - 111 mmol/L 102     ?CO2 22 - 32 mmol/L 21     ?Calcium 8.9 - 10.3 mg/dL 9.9     ?Total Protein 6.5 - 8.1 g/dL 7.9   7.3    ?Total Bilirubin 0.3 - 1.2 mg/dL 0.5   0.6    ?Alkaline Phos 38 - 126 U/L 75   88    ?AST 15 - 41 U/L 71   32    ?ALT 0 - 44 U/L 76   46    ? ? ? ? ?Imaging: ?Radiology review:  ?CLINICAL DATA:  Left lower quadrant pain ?  ?EXAM: ?CT ABDOMEN AND PELVIS WITH CONTRAST ?  ?TECHNIQUE: ?  Multidetector CT imaging of the abdomen and pelvis was  performed ?using the standard protocol following bolus administration of ?intravenous contrast. ?  ?RADIATION DOSE REDUCTION: This exam was performed according to the ?departmental dose-optimization program which includes automated ?exposure control, adjustment of the mA and/or kV according to ?patient size and/or use of iterative reconstruction technique. ?  ?CONTRAST:  12m OMNIPAQUE IOHEXOL 300 MG/ML  SOLN ?  ?COMPARISON:  None. ?  ?FINDINGS: ?Lower chest: Moderate-sized hiatal hernia.  No acute abnormality. ?  ?Hepatobiliary: Mild diffuse low-density throughout the liver ?compatible with fatty infiltration. Small low-density lesion ?adjacent to the falciform ligament measures 1.6 cm most compatible ?with small cyst. Gallbladder unremarkable. ?  ?Pancreas: No focal abnormality or ductal dilatation. ?  ?Spleen: No focal abnormality.  Normal size. ?  ?Adrenals/Urinary Tract: No adrenal abnormality. No focal renal ?abnormality. No stones or hydronephrosis. Urinary bladder is ?unremarkable. ?  ?Stomach/Bowel: Scattered colonic diverticulosis. No active ?diverticulitis. Normal appendix. Stomach and small bowel ?decompressed, unremarkable. ?  ?Vascular/Lymphatic: No evidence of aneurysm or adenopathy. ?  ?Reproductive: No visible focal abnormality. ?  ?Other: No free fluid or free air. Bilateral small inguinal hernias ?containing fat. ?  ?Musculoskeletal: No acute bony abnormality. ?  ?IMPRESSION: ?No acute findings. ?  ?Moderate-sized hiatal hernia. ?  ?Hepatic steatosis. ?  ?Colonic diverticulosis. ?  ?  ?Electronically Signed ?  By: KRolm BaptiseM.D. ?  On: 10/19/2021 22:30 ?Within last 24 hrs: No results found. ? ?Assessment ?   ?Small bilateral inguinal hernias, adipose filled.  Possible associated groin strain, potentially underlying hip pain. ?Patient Active Problem List  ? Diagnosis Date Noted  ? Overweight 11/09/2020  ? Dyslipidemia 11/09/2020  ? Essential hypertension 11/09/2020  ? ? ?Plan ?   ?We discussed  the options of robotic bilateral inguinal hernia repairs with mesh.  We discussed the role of supporting/strengthening these areas of the inguinal region.  Which may not alleviate his pain, though the potentia

## 2021-12-08 ENCOUNTER — Telehealth: Payer: Self-pay | Admitting: Surgery

## 2021-12-08 ENCOUNTER — Ambulatory Visit: Payer: Self-pay | Admitting: Surgery

## 2021-12-08 DIAGNOSIS — K4020 Bilateral inguinal hernia, without obstruction or gangrene, not specified as recurrent: Secondary | ICD-10-CM

## 2021-12-08 NOTE — Telephone Encounter (Signed)
Outgoing call is made, left message for patient to call regarding scheduled surgery:  ? ?Pre-Admission date/time, COVID Testing date and Surgery date. ? ?Surgery Date: 12/23/21 ?Preadmission Testing Date: 12/15/21 (phone 8a-1p) ?Covid Testing Date: Not needed.  ? ?Also patient will need to call at [phone], between 1-3:00pm the day before surgery, to find out what time to arrive for surgery.   ? ?

## 2021-12-08 NOTE — Telephone Encounter (Signed)
Surgery information reviewed with the patient.  ?

## 2021-12-15 ENCOUNTER — Encounter
Admission: RE | Admit: 2021-12-15 | Discharge: 2021-12-15 | Disposition: A | Discharge status: Home / Self Care | Payer: BC Managed Care – PPO | Source: Ambulatory Visit | Attending: Surgery | Admitting: Surgery

## 2021-12-15 ENCOUNTER — Other Ambulatory Visit: Payer: Self-pay

## 2021-12-15 HISTORY — DX: Personal history of other medical treatment: Z92.89

## 2021-12-15 HISTORY — DX: Polyneuropathy, unspecified: G62.9

## 2021-12-15 HISTORY — DX: Prediabetes: R73.03

## 2021-12-15 NOTE — Patient Instructions (Addendum)
Your procedure is scheduled on: 12/23/21 Report to [facility]. To find out your arrival time please call [phone] between 1PM - 3PM on 12/22/21.  Remember: Instructions that are not followed completely may result in serious medical risk, up to and including death, or upon the discretion of your surgeon and anesthesiologist your surgery may need to be rescheduled.     _X__ 1. Do not eat food or drink any liquids after midnight the night before your procedure.                 No gum chewing or hard candies.   __X__2.  On the morning of surgery brush your teeth with toothpaste and water, you                 may rinse your mouth with mouthwash if you wish.  Do not swallow any              toothpaste of mouthwash.     _X__ 3.  No Alcohol for 24 hours before or after surgery.   _X__ 4.  Do Not Smoke or use e-cigarettes For 24 Hours Prior to Your Surgery.                 Do not use any chewable tobacco products for at least 6 hours prior to                 surgery.  ____  5.  Bring all medications with you on the day of surgery if instructed.   __X__  6.  Notify your doctor if there is any change in your medical condition      (cold, fever, infections).     Do not wear jewelry, make-up, hairpins, clips or nail polish. Do not wear lotions, powders, or perfumes.  Do not shave body hair 48 hours prior to surgery. Men may shave face and neck. Do not bring valuables to the hospital.    [facility] is not responsible for any belongings or valuables.  Contacts, dentures/partials or body piercings may not be worn into surgery. Bring a case for your contacts, glasses or hearing aids, a denture cup will be supplied. Leave your suitcase in the car. After surgery it may be brought to your room. For patients admitted to the hospital, discharge time is determined by your treatment team.   Patients discharged the day of surgery will not be  allowed to drive home.   Please read over the following fact sheets that you were given:     __X__ Take these medicines the morning of surgery with A SIP OF WATER:    1. atorvastatin (LIPITOR) 80 MG tablet  2. omeprazole (PRILOSEC) 40 MG capsule  3.   4.  5.  6.  ____ Fleet Enema (as directed)   __X__ Use CHG Soap/SAGE wipes as directed  ____ Use inhalers on the day of surgery  ____ Stop metformin/Janumet/Farxiga 2 days prior to surgery    ____ Take 1/2 of usual insulin dose the night before surgery. No insulin the morning          of surgery.   ____ Stop Blood Thinners Coumadin/Plavix/Xarelto/Pleta/Pradaxa/Eliquis/Effient/Aspirin  on  Or contact your Surgeon, Cardiologist or Medical Doctor regarding  ability to stop your blood thinners  __X__ Stop Anti-inflammatories 7 days before surgery such as Advil, Ibuprofen, Motrin,  BC or Goodies Powder, Naprosyn, Naproxen, Aleve, Aspirin    __X__ Stop  all herbals and supplements, fish oil or vitamins  until after surgery.    __X__ Bring C-Pap to the hospital.

## 2021-12-16 ENCOUNTER — Ambulatory Visit: Payer: PRIVATE HEALTH INSURANCE | Admitting: Surgery

## 2021-12-18 ENCOUNTER — Other Ambulatory Visit: Payer: Self-pay | Admitting: Behavioral Health

## 2021-12-18 DIAGNOSIS — F411 Generalized anxiety disorder: Secondary | ICD-10-CM

## 2021-12-21 ENCOUNTER — Telehealth: Payer: Self-pay | Admitting: Surgery

## 2021-12-21 NOTE — Telephone Encounter (Signed)
Updated information regarding rescheduled surgery at patient's request.  ? ? ?Pre-Admission date/time, COVID Testing date and Surgery date. ? ?Surgery Date: 01/13/22 ?Preadmission Testing Date: 01/06/22 (phone 8a-1p) ?Covid Testing Date: Not needed.   ? ?Also patient will need to call at [phone], between 1-3:00pm the day before surgery, to find out what time to arrive for surgery.   ? ?

## 2021-12-23 MED ORDER — PHENYLEPHRINE HCL-NACL 20-0.9 MG/250ML-% IV SOLN
INTRAVENOUS | Status: AC
  Filled 2021-12-23: qty 250

## 2022-01-06 ENCOUNTER — Encounter: Payer: Self-pay | Admitting: Surgery

## 2022-01-06 ENCOUNTER — Encounter
Admission: RE | Admit: 2022-01-06 | Discharge: 2022-01-06 | Disposition: A | Discharge status: Home / Self Care | Payer: BC Managed Care – PPO | Source: Ambulatory Visit | Attending: Surgery | Admitting: Surgery

## 2022-01-06 DIAGNOSIS — I10 Essential (primary) hypertension: Secondary | ICD-10-CM

## 2022-01-06 DIAGNOSIS — Z79899 Other long term (current) drug therapy: Secondary | ICD-10-CM

## 2022-01-06 DIAGNOSIS — Z01818 Encounter for other preprocedural examination: Secondary | ICD-10-CM

## 2022-01-06 HISTORY — DX: Abnormal levels of other serum enzymes: R74.8

## 2022-01-06 HISTORY — DX: Claustrophobia: F40.240

## 2022-01-06 NOTE — Patient Instructions (Signed)
Your procedure is scheduled on:01-13-22 Wednesday ?Report to the Registration Desk on the 1st floor of the [facility].Then proceed to the 2nd floor Surgery Desk ?To find out your arrival time, please call [phone] between 1PM - 3PM on:01-12-22 Tuesday ?If your arrival time is 6:00 am, do not arrive prior to that time as the [facility] entrance doors do not open until 6:00 am. ? ?REMEMBER: ?Instructions that are not followed completely may result in serious medical risk, up to and including death; or upon the discretion of your surgeon and anesthesiologist your surgery may need to be rescheduled. ? ?Do not eat food OR drink any liquids after midnight the night before surgery.  ?No gum chewing, lozengers or hard candies. ? ?TAKE THESE MEDICATIONS THE MORNING OF SURGERY WITH A SIP OF WATER: ?-atorvastatin (LIPITOR)  ?-omeprazole (PRILOSEC) ?-You may take ALPRAZolam Duanne Moron) the day of surgery if needed for anxiety ? ?Bring your A-PAP to the hospital with you to the hospital ? ?One week prior to surgery: ?Stop Anti-inflammatories (NSAIDS) such as Advil, Aleve, Ibuprofen, Motrin, Naproxen, Naprosyn and Aspirin based products such as Excedrin, Goodys Powder, BC Powder.You may however, continue to take Tylenol if needed for pain up until the day of surgery. ?Stop ANY OVER THE COUNTER supplements until after surgery (VITAMIN C, MEGARED OMEGA-3 KRILL OIL , VITAMIN D, and zinc gluconate) ? ?No Alcohol for 24 hours before or after surgery. ? ?No Smoking including e-cigarettes for 24 hours prior to surgery.  ?No chewable tobacco products for at least 6 hours prior to surgery.  ?No nicotine patches on the day of surgery. ? ?Do not use any "recreational" drugs for at least a week prior to your surgery.  ?Please be advised that the combination of cocaine and anesthesia may have negative outcomes, up to and including death. ?If you test positive for cocaine, your surgery will be cancelled. ? ?On the morning of surgery brush  your teeth with toothpaste and water, you may rinse your mouth with mouthwash if you wish. ?Do not swallow any toothpaste or mouthwash. ? ?Use Dial Anti-bacterial soap the night before your surgery and again the morning of your surgery ? ?Do not wear jewelry, make-up, hairpins, clips or nail polish. ? ?Do not wear lotions, powders, or perfumes.  ? ?Do not shave body from the neck down 48 hours prior to surgery just in case you cut yourself which could leave a site for infection.  ?Also, freshly shaved skin may become irritated if using the CHG soap. ? ?Contact lenses, hearing aids and dentures may not be worn into surgery. ? ?Do not bring valuables to the hospital. [facility] is not responsible for any missing/lost belongings or valuables.  ? ?Notify your doctor if there is any change in your medical condition (cold, fever, infection). ? ?Wear comfortable clothing (specific to your surgery type) to the hospital. ? ?After surgery, you can help prevent lung complications by doing breathing exercises.  ?Take deep breaths and cough every 1-2 hours. Your doctor may order a device called an Incentive Spirometer to help you take deep breaths. ?When coughing or sneezing, hold a pillow firmly against your incision with both hands. This is called ?splinting.? Doing this helps protect your incision. It also decreases belly discomfort. ? ?If you are being admitted to the hospital overnight, leave your suitcase in the car. ?After surgery it may be brought to your room. ? ?If you are being discharged the day of surgery, you will not be allowed  to drive home. ?You will need a responsible adult (18 years or older) to drive you home and stay with you that night.  ? ?If you are taking public transportation, you will need to have a responsible adult (18 years or older) with you. ?Please confirm with your physician that it is acceptable to use public transportation.  ? ?Please call the [facility] at [phone] if  you have any questions about these instructions. ? ?Surgery Visitation Policy: ? ?Patients undergoing a surgery or procedure may have two family members or support persons with them as long as the person is not COVID-19 positive or experiencing its symptoms.  ?

## 2022-01-12 MED ORDER — LACTATED RINGERS IV SOLN
INTRAVENOUS | Status: DC
Start: 2022-01-12 — End: 2022-01-13

## 2022-01-12 MED ORDER — CHLORHEXIDINE GLUCONATE CLOTH 2 % EX PADS: 6.0000 | MEDICATED_PAD | Freq: Once | CUTANEOUS | Status: DC

## 2022-01-12 MED ORDER — ACETAMINOPHEN 500 MG PO TABS: 1000.0000 mg | ORAL_TABLET | ORAL | Status: AC

## 2022-01-12 MED ORDER — ORAL CARE MOUTH RINSE: 15.0000 mL | Freq: Once | OROMUCOSAL | Status: AC

## 2022-01-12 MED ORDER — CELECOXIB 200 MG PO CAPS: 200.0000 mg | ORAL_CAPSULE | ORAL | Status: AC

## 2022-01-12 MED ORDER — GABAPENTIN 300 MG PO CAPS: 300.0000 mg | ORAL_CAPSULE | ORAL | Status: AC

## 2022-01-12 MED ORDER — BUPIVACAINE LIPOSOME 1.3 % IJ SUSP: 20.0000 mL | Freq: Once | INTRAMUSCULAR | Status: DC

## 2022-01-12 MED ORDER — CHLORHEXIDINE GLUCONATE 0.12 % MT SOLN: 15.0000 mL | Freq: Once | OROMUCOSAL | Status: AC

## 2022-01-12 MED ORDER — CEFAZOLIN SODIUM-DEXTROSE 2-4 GM/100ML-% IV SOLN
2.0000 g | INTRAVENOUS | Status: AC
  Administered 2022-01-13: 2 g via INTRAVENOUS

## 2022-01-13 ENCOUNTER — Ambulatory Visit: Payer: BC Managed Care – PPO | Admitting: Urgent Care

## 2022-01-13 ENCOUNTER — Encounter
Admission: RE | Disposition: A | Discharge status: Home / Self Care | Payer: Self-pay | Source: Home / Self Care | Attending: Surgery

## 2022-01-13 ENCOUNTER — Encounter: Payer: Self-pay | Admitting: Surgery

## 2022-01-13 ENCOUNTER — Other Ambulatory Visit: Payer: Self-pay

## 2022-01-13 ENCOUNTER — Ambulatory Visit
Admission: RE | Admit: 2022-01-13 | Discharge: 2022-01-13 | Disposition: A | Discharge status: Home / Self Care | Payer: BC Managed Care – PPO | Attending: Surgery | Admitting: Surgery

## 2022-01-13 DIAGNOSIS — K4020 Bilateral inguinal hernia, without obstruction or gangrene, not specified as recurrent: Secondary | ICD-10-CM

## 2022-01-13 DIAGNOSIS — I10 Essential (primary) hypertension: Secondary | ICD-10-CM | POA: Diagnosis not present

## 2022-01-13 DIAGNOSIS — K420 Umbilical hernia with obstruction, without gangrene: Secondary | ICD-10-CM

## 2022-01-13 DIAGNOSIS — E669 Obesity, unspecified: Secondary | ICD-10-CM | POA: Insufficient documentation

## 2022-01-13 DIAGNOSIS — K449 Diaphragmatic hernia without obstruction or gangrene: Secondary | ICD-10-CM | POA: Diagnosis not present

## 2022-01-13 DIAGNOSIS — K219 Gastro-esophageal reflux disease without esophagitis: Secondary | ICD-10-CM | POA: Insufficient documentation

## 2022-01-13 DIAGNOSIS — G4730 Sleep apnea, unspecified: Secondary | ICD-10-CM | POA: Diagnosis not present

## 2022-01-13 DIAGNOSIS — K429 Umbilical hernia without obstruction or gangrene: Secondary | ICD-10-CM | POA: Diagnosis present

## 2022-01-13 HISTORY — DX: Hereditary motor and sensory neuropathy: G60.0

## 2022-01-13 HISTORY — PX: INSERTION OF MESH: SHX5868

## 2022-01-13 HISTORY — DX: Obstructive sleep apnea (adult) (pediatric): G47.33

## 2022-01-13 HISTORY — DX: Diaphragmatic hernia without obstruction or gangrene: K44.9

## 2022-01-13 SURGERY — REPAIR, HERNIA, INGUINAL, BILATERAL, ROBOT-ASSISTED
Anesthesia: General | Laterality: Bilateral

## 2022-01-13 MED ORDER — FENTANYL CITRATE (PF) 100 MCG/2ML IJ SOLN
INTRAMUSCULAR | Status: AC
  Administered 2022-01-13: 25 ug via INTRAVENOUS
  Filled 2022-01-13: qty 2

## 2022-01-13 MED ORDER — FENTANYL CITRATE (PF) 100 MCG/2ML IJ SOLN
INTRAMUSCULAR | Status: DC | PRN
Start: 2022-01-13 — End: 2022-01-13
  Administered 2022-01-13: 100 ug via INTRAVENOUS

## 2022-01-13 MED ORDER — OXYCODONE HCL 5 MG PO TABS: 5.0000 mg | ORAL_TABLET | Freq: Four times a day (QID) | ORAL | 0 refills | Status: DC | PRN

## 2022-01-13 MED ORDER — FENTANYL CITRATE (PF) 100 MCG/2ML IJ SOLN
25.0000 ug | INTRAMUSCULAR | Status: AC | PRN
  Administered 2022-01-13 (×4): 25 ug via INTRAVENOUS

## 2022-01-13 MED ORDER — OXYCODONE HCL 5 MG PO TABS: 5.0000 mg | ORAL_TABLET | Freq: Once | ORAL | Status: AC | PRN

## 2022-01-13 MED ORDER — ONDANSETRON HCL 4 MG/2ML IJ SOLN
INTRAMUSCULAR | Status: DC | PRN
Start: 2022-01-13 — End: 2022-01-13
  Administered 2022-01-13: 4 mg via INTRAVENOUS

## 2022-01-13 MED ORDER — PROPOFOL 500 MG/50ML IV EMUL
INTRAVENOUS | Status: AC
  Filled 2022-01-13: qty 50

## 2022-01-13 MED ORDER — PROMETHAZINE HCL 25 MG/ML IJ SOLN: 6.2500 mg | INTRAMUSCULAR | Status: DC | PRN

## 2022-01-13 MED ORDER — CHLORHEXIDINE GLUCONATE 0.12 % MT SOLN
OROMUCOSAL | Status: AC
  Administered 2022-01-13: 15 mL via OROMUCOSAL
  Filled 2022-01-13: qty 15

## 2022-01-13 MED ORDER — IBUPROFEN 800 MG PO TABS: 800.0000 mg | ORAL_TABLET | Freq: Three times a day (TID) | ORAL | 0 refills | Status: DC | PRN

## 2022-01-13 MED ORDER — PHENYLEPHRINE HCL (PRESSORS) 10 MG/ML IV SOLN
INTRAVENOUS | Status: DC | PRN
  Administered 2022-01-13: 160 ug via INTRAVENOUS

## 2022-01-13 MED ORDER — MIDAZOLAM HCL 2 MG/2ML IJ SOLN
INTRAMUSCULAR | Status: AC
  Filled 2022-01-13: qty 2

## 2022-01-13 MED ORDER — OXYCODONE HCL 5 MG/5ML PO SOLN: 5.0000 mg | Freq: Once | ORAL | Status: AC | PRN

## 2022-01-13 MED ORDER — FENTANYL CITRATE (PF) 100 MCG/2ML IJ SOLN
INTRAMUSCULAR | Status: AC
  Filled 2022-01-13: qty 2

## 2022-01-13 MED ORDER — PROPOFOL 10 MG/ML IV BOLUS
INTRAVENOUS | Status: DC | PRN
  Administered 2022-01-13: 350 mg via INTRAVENOUS

## 2022-01-13 MED ORDER — CELECOXIB 200 MG PO CAPS
ORAL_CAPSULE | ORAL | Status: AC
  Administered 2022-01-13: 200 mg via ORAL
  Filled 2022-01-13: qty 1

## 2022-01-13 MED ORDER — ACETAMINOPHEN 500 MG PO TABS
ORAL_TABLET | ORAL | Status: AC
  Administered 2022-01-13: 1000 mg via ORAL
  Filled 2022-01-13: qty 2

## 2022-01-13 MED ORDER — CEFAZOLIN SODIUM-DEXTROSE 2-4 GM/100ML-% IV SOLN
INTRAVENOUS | Status: AC
  Filled 2022-01-13: qty 100

## 2022-01-13 MED ORDER — DEXAMETHASONE SODIUM PHOSPHATE 10 MG/ML IJ SOLN
INTRAMUSCULAR | Status: DC | PRN
  Administered 2022-01-13: 10 mg via INTRAVENOUS

## 2022-01-13 MED ORDER — OXYCODONE HCL 5 MG PO TABS
ORAL_TABLET | ORAL | Status: AC
  Administered 2022-01-13: 5 mg via ORAL
  Filled 2022-01-13: qty 1

## 2022-01-13 MED ORDER — SUGAMMADEX SODIUM 500 MG/5ML IV SOLN
INTRAVENOUS | Status: DC | PRN
  Administered 2022-01-13: 400 mg via INTRAVENOUS

## 2022-01-13 MED ORDER — MIDAZOLAM HCL 2 MG/2ML IJ SOLN
INTRAMUSCULAR | Status: DC | PRN
  Administered 2022-01-13: 2 mg via INTRAVENOUS

## 2022-01-13 MED ORDER — LACTATED RINGERS IV SOLN: INTRAVENOUS | Status: DC | PRN

## 2022-01-13 MED ORDER — SUGAMMADEX SODIUM 500 MG/5ML IV SOLN
INTRAVENOUS | Status: AC
  Filled 2022-01-13: qty 5

## 2022-01-13 MED ORDER — BUPIVACAINE-EPINEPHRINE (PF) 0.25% -1:200000 IJ SOLN
INTRAMUSCULAR | Status: DC | PRN
  Administered 2022-01-13: 30 mL

## 2022-01-13 MED ORDER — DROPERIDOL 2.5 MG/ML IJ SOLN: 0.6250 mg | Freq: Once | INTRAMUSCULAR | Status: DC | PRN

## 2022-01-13 MED ORDER — BUPIVACAINE LIPOSOME 1.3 % IJ SUSP
INTRAMUSCULAR | Status: AC
  Filled 2022-01-13: qty 20

## 2022-01-13 MED ORDER — KETAMINE HCL 10 MG/ML IJ SOLN
INTRAMUSCULAR | Status: DC | PRN
  Administered 2022-01-13: 50 mg via INTRAVENOUS

## 2022-01-13 MED ORDER — ROCURONIUM BROMIDE 100 MG/10ML IV SOLN
INTRAVENOUS | Status: DC | PRN
  Administered 2022-01-13: 70 mg via INTRAVENOUS
  Administered 2022-01-13: 30 mg via INTRAVENOUS
  Administered 2022-01-13: 20 mg via INTRAVENOUS

## 2022-01-13 MED ORDER — FENTANYL CITRATE (PF) 100 MCG/2ML IJ SOLN
INTRAMUSCULAR | Status: AC
  Administered 2022-01-13: 50 ug via INTRAVENOUS
  Filled 2022-01-13: qty 2

## 2022-01-13 MED ORDER — BUPIVACAINE-EPINEPHRINE (PF) 0.25% -1:200000 IJ SOLN
INTRAMUSCULAR | Status: AC
  Filled 2022-01-13: qty 30

## 2022-01-13 MED ORDER — LIDOCAINE HCL (CARDIAC) PF 100 MG/5ML IV SOSY
PREFILLED_SYRINGE | INTRAVENOUS | Status: DC | PRN
Start: 2022-01-13 — End: 2022-01-13
  Administered 2022-01-13: 100 mg via INTRAVENOUS

## 2022-01-13 MED ORDER — GABAPENTIN 300 MG PO CAPS
ORAL_CAPSULE | ORAL | Status: AC
  Administered 2022-01-13: 300 mg via ORAL
  Filled 2022-01-13: qty 1

## 2022-01-13 MED ORDER — ACETAMINOPHEN 10 MG/ML IV SOLN: 1000.0000 mg | Freq: Once | INTRAVENOUS | Status: DC | PRN

## 2022-01-13 MED ORDER — KETAMINE HCL 50 MG/5ML IJ SOSY
PREFILLED_SYRINGE | INTRAMUSCULAR | Status: AC
Start: 2022-01-13 — End: ?
  Filled 2022-01-13: qty 5

## 2022-01-13 SURGICAL SUPPLY — 50 items
BLADE CLIPPER SURG (BLADE) ×3 IMPLANT
COVER TIP SHEARS 8 DVNC (MISCELLANEOUS) ×2 IMPLANT
COVER TIP SHEARS 8MM DA VINCI (MISCELLANEOUS) ×1
COVER WAND RF STERILE (DRAPES) ×3 IMPLANT
DERMABOND ADVANCED (GAUZE/BANDAGES/DRESSINGS) ×1
DERMABOND ADVANCED .7 DNX12 (GAUZE/BANDAGES/DRESSINGS) ×2 IMPLANT
DRAPE ARM DVNC X/XI (DISPOSABLE) ×6 IMPLANT
DRAPE COLUMN DVNC XI (DISPOSABLE) ×2 IMPLANT
DRAPE DA VINCI XI ARM (DISPOSABLE) ×3
DRAPE DA VINCI XI COLUMN (DISPOSABLE) ×1
ELECT REM PT RETURN 9FT ADLT (ELECTROSURGICAL) ×3
ELECTRODE REM PT RTRN 9FT ADLT (ELECTROSURGICAL) ×2 IMPLANT
GLOVE ORTHO TXT STRL SZ7.5 (GLOVE) ×11 IMPLANT
GOWN STRL REUS W/ TWL LRG LVL3 (GOWN DISPOSABLE) ×2 IMPLANT
GOWN STRL REUS W/ TWL XL LVL3 (GOWN DISPOSABLE) ×4 IMPLANT
GOWN STRL REUS W/TWL LRG LVL3 (GOWN DISPOSABLE) ×1
GOWN STRL REUS W/TWL XL LVL3 (GOWN DISPOSABLE) ×2
GRASPER SUT TROCAR 14GX15 (MISCELLANEOUS) IMPLANT
IRRIGATION STRYKERFLOW (MISCELLANEOUS) IMPLANT
IRRIGATOR STRYKERFLOW (MISCELLANEOUS)
IV CATH ANGIO 14GX1.88 NO SAFE (IV SOLUTION) ×3 IMPLANT
IV NS 1000ML (IV SOLUTION)
IV NS 1000ML BAXH (IV SOLUTION) IMPLANT
KIT PINK PAD W/HEAD ARE REST (MISCELLANEOUS) ×3
KIT PINK PAD W/HEAD ARM REST (MISCELLANEOUS) ×2 IMPLANT
LABEL OR SOLS (LABEL) ×3 IMPLANT
MANIFOLD NEPTUNE II (INSTRUMENTS) ×3 IMPLANT
MESH 3DMAX LIGHT 4.8X6.7 LT XL (Mesh General) ×1 IMPLANT
MESH 3DMAX LIGHT 4.8X6.7 RT XL (Mesh General) ×1 IMPLANT
NDL INSUFFLATION 14GA 120MM (NEEDLE) IMPLANT
NEEDLE HYPO 22GX1.5 SAFETY (NEEDLE) ×3 IMPLANT
NEEDLE INSUFFLATION 14GA 120MM (NEEDLE) ×3 IMPLANT
PACK LAP CHOLECYSTECTOMY (MISCELLANEOUS) ×3 IMPLANT
SEAL CANN UNIV 5-8 DVNC XI (MISCELLANEOUS) ×6 IMPLANT
SEAL XI 5MM-8MM UNIVERSAL (MISCELLANEOUS) ×3
SET TUBE SMOKE EVAC HIGH FLOW (TUBING) ×3 IMPLANT
SOLUTION ELECTROLUBE (MISCELLANEOUS) ×3 IMPLANT
SUT MNCRL 4-0 (SUTURE) ×1
SUT MNCRL 4-0 27XMFL (SUTURE) ×2
SUT STRATAFIX 0 PDS+ CT-2 23 (SUTURE) ×3
SUT V-LOC 90 ABS 3-0 VLT  V-20 (SUTURE)
SUT V-LOC 90 ABS 3-0 VLT V-20 (SUTURE) IMPLANT
SUT VIC AB 0 CT2 27 (SUTURE) ×3 IMPLANT
SUT VIC AB 2-0 RB1 27 (SUTURE) ×3 IMPLANT
SUT VLOC 90 2/L VL 12 GS22 (SUTURE) IMPLANT
SUT VLOC 90 S/L VL9 GS22 (SUTURE) ×2 IMPLANT
SUTURE MNCRL 4-0 27XMF (SUTURE) ×2 IMPLANT
SUTURE STRATFX 0 PDS+ CT-2 23 (SUTURE) IMPLANT
TROCAR Z-THREAD FIOS 11X100 BL (TROCAR) IMPLANT
WATER STERILE IRR 500ML POUR (IV SOLUTION) ×2 IMPLANT

## 2022-01-13 NOTE — Transfer of Care (Signed)
Immediate Anesthesia Transfer of Care Note ? ?Patient: Edward [name] ? ?Procedure(s) Performed: XI ROBOTIC ASSISTED BILATERAL INGUINAL HERNIA (Bilateral) ?INSERTION OF MESH ?XI ROBOT ASSISTED UMBILICAL HERNIA REPAIR ? ?Patient Location: PACU ? ?Anesthesia Type:General ? ?Level of Consciousness: drowsy ? ?Airway & Oxygen Therapy: Patient Spontanous Breathing and Patient connected to face mask oxygen ? ?Post-op Assessment: Report given to RN and Post -op Vital signs reviewed and stable ? ?Post vital signs: Reviewed and stable ? ?Last Vitals:  ?Vitals Value Taken Time  ?BP    ?Temp    ?Pulse    ?Resp    ?SpO2    ? ? ?Last Pain:  ?Vitals:  ? 01/13/22 0844  ?TempSrc: Temporal  ?PainSc: 0-No pain  ?   ? ?  ? ?Complications: No notable events documented. ?

## 2022-01-13 NOTE — H&P (Signed)
Chief Complaint: Bilateral groin pain, x2 months ?  ?History of Present Illness ?Edward [name] is a 56 y.o. male with roughly 67-monthhistory of bilateral groin pain, seemingly exacerbated by coughing, riding a lawnmower and bouncing.  He does very little heavy lifting to demonstrate the pain.  He did have increased pain while walking uphill.  It is mostly a discomfort, however he did have significant pain back in February when he had a presentation to this ED and CT scan evaluation.  The CT scan showed bilateral small inguinal hernias that with fat filled inguinal canals.  Today he is a good day his pain is 1 mostly a discomfort.  Mostly its bilateral were centered over the suprapubic area, feeling as though squeezing process when he tries to stretch himself out straight. ? ?He also wants to address his small umbilical hernia.  ?  ?Past Medical History ?    ?Past Medical History:  ?Diagnosis Date  ? A-pap    ? Anxiety    ? Barrett's esophagus    ? GERD (gastroesophageal reflux disease)    ? Hyperlipidemia    ? Hypertension    ?  ?  ?  ?History reviewed. No pertinent surgical history. ?  ?No Known Allergies ?  ?      ?Current Outpatient Medications  ?Medication Sig Dispense Refill  ? ALPRAZolam (XANAX) 0.5 MG tablet Take 1 tablet (0.5 mg total) by mouth 3 (three) times daily as needed. 90 tablet 2  ? amLODipine (NORVASC) 2.5 MG tablet Take 1 tablet (2.5 mg total) by mouth at bedtime. 90 tablet 3  ? Ascorbic Acid (VITAMIN C PO) Take by mouth.      ? atorvastatin (LIPITOR) 80 MG tablet Take 1 tablet (80 mg total) by mouth daily. 90 tablet 3  ? famotidine (PEPCID) 20 MG tablet Take 1 tablet (20 mg total) by mouth at bedtime. 90 tablet 3  ? gabapentin (NEURONTIN) 100 MG capsule Take 100 mg by mouth 3 (three) times daily.      ? Omega-3 Fatty Acids (FISH OIL PO) Take by mouth.      ? omeprazole (PRILOSEC) 40 MG capsule TAKE 1 CAPSULE BY MOUTH EVERY DAY 90 capsule 1  ? propranolol (INDERAL) 10 MG tablet Take 1 tablet  (10 mg total) by mouth 3 (three) times daily. 90 tablet 1  ? valsartan-hydrochlorothiazide (DIOVAN HCT) 160-12.5 MG tablet Take 1 tablet by mouth daily. 90 tablet 3  ? VITAMIN D PO Take by mouth.      ?  ?No current facility-administered medications for this visit.  ?  ?  ?Family History ?     ?Family History  ?Problem Relation Age of Onset  ? Depression Mother    ? Anxiety disorder Mother    ? Colon polyps Mother    ? Diabetes Mother    ? Multiple sclerosis Mother    ? Depression Father    ? Anxiety disorder Father    ? Heart disease Father    ?      Pacemaker  ? Heart attack Father    ? Stroke Father    ? Anxiety disorder Sister    ? Heart attack Paternal Grandfather    ? Anxiety disorder Paternal Grandmother    ? Cancer Paternal Grandmother    ?      ?  ? Heart disease Other    ? Colon cancer Neg Hx    ? Esophageal cancer Neg Hx    ? Stomach cancer  Neg Hx    ? Rectal cancer Neg Hx    ?  ?  ?  ?Social History ?Social History  ?  ?     ?Tobacco Use  ? Smoking status: Never  ? Smokeless tobacco: Never  ?Vaping Use  ? Vaping Use: Never used  ?Substance Use Topics  ? Alcohol use: Yes  ?    Alcohol/week: 4.0 standard drinks  ?    Types: 4 Glasses of wine per week  ?    Comment: 4 drinks  a day  ? Drug use: Never  ?  ?  ?  ?  ?Review of Systems  ?HENT:  Positive for tinnitus.   ?Gastrointestinal:  Positive for abdominal pain and diarrhea.  ?Neurological:  Positive for tingling (Peripheral neuropathy).  ?Psychiatric/Behavioral:  The patient is nervous/anxious.   ?All other systems reviewed and are negative. ?  ?  ?Physical Exam ?Blood pressure (!) 149/90, pulse (!) 102, temperature 97.9 ?F (36.6 ?C), temperature source Oral, height '5\' 11"'$  (1.803 m), weight 233 lb 9.6 oz (106 kg), SpO2 97 %. ?Last Weight  Most recent update: 12/03/2021  2:19 PM  ?  ?  Weight  ?106 kg (233 lb 9.6 oz)  ?          ?  ?   ?  ?  ?CONSTITUTIONAL: Well developed, and nourished, appropriately responsive and aware without distress.   ?EYES: Sclera  non-icteric.   ?EARS, NOSE, MOUTH AND THROAT:  The oropharynx is clear. Oral mucosa is pink and moist.    Hearing is intact to voice.  ?NECK: Trachea is midline, and there is no jugular venous distension.  ?LYMPH NODES:  Lymph nodes in the neck are not enlarged. ?RESPIRATORY:  Lungs are clear, and breath sounds are equal bilaterally. Normal respiratory effort without pathologic use of accessory muscles. ?CARDIOVASCULAR: Heart is regular in rate and rhythm. ?GI: The abdomen is soft, nontender, and nondistended.   There is a small umbilical defect, tender with likely preperitoneal tissue incarcerated.  ?GU: In lieu of exam, we reviewed CT imaging. ?MUSCULOSKELETAL:  Symmetrical muscle tone appreciated in all four extremities.    ?SKIN: Skin turgor is normal. No pathologic skin lesions appreciated.  ?NEUROLOGIC:  Motor and sensation appear grossly normal.  Cranial nerves are grossly without defect. ?PSYCH:  Alert and oriented to person, place and time. Affect is appropriate for situation. ?  ?Data Reviewed ?I have personally reviewed what is currently available of the patient's imaging, recent labs and medical records.   ?Labs:  ?  ?  Latest Ref Rng & Units 10/19/2021  ?  8:36 PM 11/10/2020  ? 11:32 AM  ?CBC  ?WBC 4.0 - 10.5 K/uL 8.1   6.9    ?Hemoglobin 13.0 - 17.0 g/dL 16.2   17.4    ?Hematocrit 39.0 - 52.0 % 48.1   51.0    ?Platelets 150 - 400 K/uL 205   208    ?  ?  ?  Latest Ref Rng & Units 10/19/2021  ?  8:36 PM 11/10/2020  ? 11:32 AM  ?CMP  ?Glucose 70 - 99 mg/dL 96      ?BUN 6 - 20 mg/dL 13      ?Creatinine 0.61 - 1.24 mg/dL 0.78      ?Sodium 135 - 145 mmol/L 138      ?Potassium 3.5 - 5.1 mmol/L 3.9      ?Chloride 98 - 111 mmol/L 102      ?CO2 22 -  32 mmol/L 21      ?Calcium 8.9 - 10.3 mg/dL 9.9      ?Total Protein 6.5 - 8.1 g/dL 7.9   7.3    ?Total Bilirubin 0.3 - 1.2 mg/dL 0.5   0.6    ?Alkaline Phos 38 - 126 U/L 75   88    ?AST 15 - 41 U/L 71   32    ?ALT 0 - 44 U/L 76   46    ?  ?  ?  ?  ?Imaging: ?Radiology  review:  ?CLINICAL DATA:  Left lower quadrant pain ?  ?EXAM: ?CT ABDOMEN AND PELVIS WITH CONTRAST ?  ?TECHNIQUE: ?Multidetector CT imaging of the abdomen and pelvis was performed ?using the standard protocol following bolus administration of ?intravenous contrast. ?  ?RADIATION DOSE REDUCTION: This exam was performed according to the ?departmental dose-optimization program which includes automated ?exposure control, adjustment of the mA and/or kV according to ?patient size and/or use of iterative reconstruction technique. ?  ?CONTRAST:  166m OMNIPAQUE IOHEXOL 300 MG/ML  SOLN ?  ?COMPARISON:  None. ?  ?FINDINGS: ?Lower chest: Moderate-sized hiatal hernia.  No acute abnormality. ?  ?Hepatobiliary: Mild diffuse low-density throughout the liver ?compatible with fatty infiltration. Small low-density lesion ?adjacent to the falciform ligament measures 1.6 cm most compatible ?with small cyst. Gallbladder unremarkable. ?  ?Pancreas: No focal abnormality or ductal dilatation. ?  ?Spleen: No focal abnormality.  Normal size. ?  ?Adrenals/Urinary Tract: No adrenal abnormality. No focal renal ?abnormality. No stones or hydronephrosis. Urinary bladder is ?unremarkable. ?  ?Stomach/Bowel: Scattered colonic diverticulosis. No active ?diverticulitis. Normal appendix. Stomach and small bowel ?decompressed, unremarkable. ?  ?Vascular/Lymphatic: No evidence of aneurysm or adenopathy. ?  ?Reproductive: No visible focal abnormality. ?  ?Other: No free fluid or free air. Bilateral small inguinal hernias ?containing fat. ?  ?Musculoskeletal: No acute bony abnormality. ?  ?IMPRESSION: ?No acute findings. ?  ?Moderate-sized hiatal hernia. ?  ?Hepatic steatosis. ?  ?Colonic diverticulosis. ?  ?  ?Electronically Signed ?  By: KRolm BaptiseM.D. ?  On: 10/19/2021 22:30 ?Within last 24 hrs: No results found. ?  ?Assessment ?  ?Assessment ?   ?Small bilateral inguinal hernias, adipose filled.  Possible associated groin strain, potentially  underlying hip pain. ?    ?Patient Active Problem List  ?  Diagnosis Date Noted  ? Overweight 11/09/2020  ? Dyslipidemia 11/09/2020  ? Essential hypertension 11/09/2020  ?  ?  ?Plan ?  ?  ?Plan ?   ?We discussed

## 2022-01-13 NOTE — Anesthesia Postprocedure Evaluation (Signed)
Anesthesia Post Note ? ?Patient: Edward [name] ? ?Procedure(s) Performed: XI ROBOTIC ASSISTED BILATERAL INGUINAL HERNIA (Bilateral) ?INSERTION OF MESH ?XI ROBOT ASSISTED UMBILICAL HERNIA REPAIR ? ?Patient location during evaluation: PACU ?Anesthesia Type: General ?Level of consciousness: awake and alert ?Pain management: pain level controlled ?Vital Signs Assessment: post-procedure vital signs reviewed and stable ?Respiratory status: spontaneous breathing, nonlabored ventilation and respiratory function stable ?Cardiovascular status: blood pressure returned to baseline and stable ?Postop Assessment: no apparent nausea or vomiting ?Anesthetic complications: no ? ? ?No notable events documented. ? ? ?Last Vitals:  ?Vitals:  ? 01/13/22 1400 01/13/22 1420  ?BP: 122/86 (!) 131/98  ?Pulse: 80 84  ?Resp: 17 18  ?Temp: (!) 36.1 ?C 36.7 ?C  ?SpO2: 98% 96%  ?  ?Last Pain:  ?Vitals:  ? 01/13/22 1420  ?TempSrc: Oral  ?PainSc:   ? ? ?  ?  ?  ?  ?  ?  ? ?[place] Ouch ? ? ? ? ?

## 2022-01-13 NOTE — Op Note (Signed)
Robotic assisted Laparoscopic Transabdominal bilateral inguinal Hernia Repair with Mesh, and primary repair of 1 cm anterior abdominal wall/umbilical fascial defect. ?  ?  ?  ?Pre-operative Diagnosis: Bilateral inguinal Hernia & umbilical hernia ?  ?Post-operative Diagnosis: Same ?  ?Procedure: Robotic assisted Laparoscopic  repair of bilateral inguinal hernia(s), and primary repair of 1 cm anterior abdominal wall fascial defect. ?  ?Surgeon: Ronny Bacon, M.D., FACS ?  ?Anesthesia: GETA ?  ?Findings: Bilateral inguinal hernia,  evidence of 1 cm anterior abdominal wall fascial defect, with incarcerated preperitoneal adipose..       ?  ?Procedure Details  ?The patient was seen again in the Holding Room. The benefits, complications, treatment options, and expected outcomes were discussed with the patient. The risks of bleeding, infection, recurrence of symptoms, failure to resolve symptoms, recurrence of hernia, ischemic orchitis, chronic pain syndrome or neuroma, were reviewed again. The likelihood of improving the patient's symptoms with return to their baseline status is good.  The patient and/or family concurred with the proposed plan, giving informed consent.  The patient was taken to Operating Room, identified  and the procedure verified as Laparoscopic Inguinal Hernia Repair. Laterality confirmed. ? A Time Out was held and the above information confirmed. ?  ?Prior to the induction of general anesthesia, antibiotic prophylaxis was administered. VTE prophylaxis was in place. General endotracheal anesthesia was then administered and tolerated well. After the induction, the abdomen was prepped with Chloraprep and draped in the sterile fashion. The patient was positioned in the supine position. ?  ?After local infiltration of quarter percent Marcaine with epinephrine, stab incision was made left upper quadrant.  On the left at Palmer's point, the Veress needle is passed with sensation of the layers to penetrate  the abdominal wall and into the peritoneum.  Saline drop test is confirmed peritoneal placement.  Insufflation is initiated with carbon dioxide to pressures of 15 mmHg. ?An 8.5 mm port is placed to the left off of the midline, with blunt tipped trocar.  ?Pneumoperitoneum maintained w/o HD changes to pressures of 15 mm Hg with CO2. No evidence of bowel injuries.   ?For the Veress needle was placed in the preperitoneal plane through the right groin, and insufflation of the space was completed with CO2.  Veress needle was kept for later decompression of this plane. ?Two 8.5 mm ports placed under direct vision in each upper quadrant. The laparoscopy revealed bilateral direct and indirect (fat filled on the left) defect(s).   ?The robot was brought ot the table and docked in the standard fashion, no collision between arms was observed. Instruments were kept under direct view at all times. ?For bilateral inguinal hernia repair,  I developed a peritoneal flap. The sac(s) were reduced and dissected free from adjacent structures. We preserved the vas and the vessels, and visualized them to their convergence and beyond in the retroperitoneum. Once dissection was completed a an extra-large left sided BARD 3D Light mesh was placed and secured at 4 points with interrupted 2-0 Vicryl to the pubic tubercle and anteriorly. ?There was good coverage of the direct, indirect and femoral spaces. ? ?Second look revealed no complications or injuries. ?Attention then was turned to the opposite side. The sac had also been reduced and dissected free from adjacent structures. We preserved the vas and the vessels, in like manner with adequate posterior dissection. Once dissection was completed the same, but contralateral mesh was placed and secured in like manner with interrupted 2-0 Vicryl. ?There was good coverage  of the direct, indirect and femoral spaces. ? ?The flap was then closed with 2-0 V-lock suture.  Peritoneal closure without  defects.  ?The Veress needle was opened to atmospheric pressure. ? ?I then utilized a 0 strata fix suture, and after clearing the periumbilical area from preperitoneal adipose, I primarily closed the fascial defect with suture alone, not warranting the addition of mesh to this area. ? ?Once assuring that hemostasis was adequate, all needles/sponges removed, and the robot was undocked.  ?The ports were removed, the abdomen desulflated.  4-0 subcuticular Monocryl was used at all skin edges. Dermabond was placed.  ?Patient tolerated the procedure well. There were no complications. He was taken to the recovery room in stable condition.  ?  ? ?      ?Ronny Bacon, M.D., FACS ?01/13/2022, 12:04 PM  ?

## 2022-01-13 NOTE — Anesthesia Procedure Notes (Signed)
Procedure Name: Intubation ?Date/Time: 01/13/2022 9:35 AM ?Performed by: Beverely Low, CRNA ?Pre-anesthesia Checklist: Patient identified, Patient being monitored, Timeout performed, Emergency Drugs available and Suction available ?Patient Re-evaluated:Patient Re-evaluated prior to induction ?Oxygen Delivery Method: Circle system utilized ?Preoxygenation: Pre-oxygenation with 100% oxygen ?Induction Type: IV induction ?Ventilation: Mask ventilation without difficulty ?Laryngoscope Size: McGraph and 4 ?Grade View: Grade I ?Tube type: Oral ?Tube size: 7.5 mm ?Number of attempts: 1 ?Airway Equipment and Method: Stylet ?Placement Confirmation: ETT inserted through vocal cords under direct vision, positive ETCO2 and breath sounds checked- equal and bilateral ?Secured at: 21 cm ?Tube secured with: Tape ?Dental Injury: Teeth and Oropharynx as per pre-operative assessment  ? ? ? ? ?

## 2022-01-13 NOTE — Discharge Instructions (Addendum)

## 2022-01-13 NOTE — Anesthesia Preprocedure Evaluation (Addendum)
Anesthesia Evaluation  ?Patient identified by MRN, date of birth, ID band ?Patient awake ? ? ? ?Reviewed: ?Allergy & Precautions, NPO status , Patient's Chart, lab work & pertinent test results, reviewed documented beta blocker date and time  ? ?Airway ?Mallampati: III ? ?TM Distance: >3 FB ?Neck ROM: full ? ? ? Dental ?no notable dental hx. ? ?  ?Pulmonary ?sleep apnea and Continuous Positive Airway Pressure Ventilation ,  ?  ?Pulmonary exam normal ? ? ? ? ? ? ? Cardiovascular ?Exercise Tolerance: Good ?hypertension, Pt. on medications and Pt. on home beta blockers ?Normal cardiovascular exam ? ? ?  ?Neuro/Psych ?PSYCHIATRIC DISORDERS Anxiety Charcot-Marie-Tooth disease ? Neuromuscular disease   ? GI/Hepatic ?Neg liver ROS, hiatal hernia (moderate), GERD  Medicated,  ?Endo/Other  ?negative endocrine ROS ? Renal/GU ?  ? ?  ?Musculoskeletal ? ? Abdominal ?(+) + obese,   ?Peds ? Hematology ?negative hematology ROS ?(+)   ?Anesthesia Other Findings ?bilateral inguinal hernia ? ?Pre-diabetes ?Elevated liver enzymes ? ? Reproductive/Obstetrics ?negative OB ROS ? ?  ? ? ? ? ? ? ? ? ? ? ? ? ? ?  ?  ? ? ? ? ? ? ?Anesthesia Physical ?Anesthesia Plan ? ?ASA: 2 ? ?Anesthesia Plan: General ETT  ? ?Post-op Pain Management: Gabapentin PO (pre-op)*, Tylenol PO (pre-op)* and Celebrex PO (pre-op)*  ? ?Induction: Intravenous ? ?PONV Risk Score and Plan: Ondansetron, Dexamethasone, Midazolam and Treatment may vary due to age or medical condition ? ?Airway Management Planned: Oral ETT ? ?Additional Equipment:  ? ?Intra-op Plan:  ? ?Post-operative Plan: Extubation in OR ? ?Informed Consent: I have reviewed the patients History and Physical, chart, labs and discussed the procedure including the risks, benefits and alternatives for the proposed anesthesia with the patient or authorized representative who has indicated his/her understanding and acceptance.  ? ? ? ?Dental advisory given ? ?Plan Discussed  with: Anesthesiologist, CRNA and Surgeon ? ?Anesthesia Plan Comments:   ? ? ? ? ? ?Anesthesia Quick Evaluation ? ?

## 2022-01-26 ENCOUNTER — Ambulatory Visit (INDEPENDENT_AMBULATORY_CARE_PROVIDER_SITE_OTHER): Payer: BC Managed Care – PPO | Admitting: Surgery

## 2022-01-26 ENCOUNTER — Encounter: Payer: BC Managed Care – PPO | Admitting: Physician Assistant

## 2022-01-26 ENCOUNTER — Encounter: Payer: Self-pay | Admitting: Surgery

## 2022-01-26 VITALS — BP 118/77 | HR 103 | Temp 98.3°F | Wt 230.2 lb

## 2022-01-26 DIAGNOSIS — K4020 Bilateral inguinal hernia, without obstruction or gangrene, not specified as recurrent: Secondary | ICD-10-CM

## 2022-01-26 DIAGNOSIS — Z8719 Personal history of other diseases of the digestive system: Secondary | ICD-10-CM | POA: Insufficient documentation

## 2022-01-26 DIAGNOSIS — Z09 Encounter for follow-up examination after completed treatment for conditions other than malignant neoplasm: Secondary | ICD-10-CM

## 2022-01-26 DIAGNOSIS — K420 Umbilical hernia with obstruction, without gangrene: Secondary | ICD-10-CM

## 2022-01-26 NOTE — Progress Notes (Signed)
[facility] POST-OP OFFICE VISIT  01/26/2022  HPI: Edward [name] is a 56 y.o. male 13 days s/p robotic assisted bilateral inguinal hernia repairs with mesh, and primary repair of small umbilical hernia defect.  Still moving cautiously, most of the pain is in the periumbilical area.  Vital signs: There were no vitals taken for this visit.   Physical Exam: Constitutional: He appears well. Abdomen: Soft nontender benign. Skin: Incisions are clean, dry and intact.  There is no evidence of any residual/recurrent inguinal bulge.  The umbilical area is somewhat tender on exam.  Assessment/Plan: This is a 56 y.o. male 13 days s/p robotic repair of bilateral inguinal hernias with mesh, and primary repair of small anterior abdominal wall fascial defect less than 3 cm.  Patient Active Problem List   Diagnosis Date Noted   Non-recurrent bilateral inguinal hernia without obstruction or gangrene 12/03/2021   Overweight 11/09/2020   Dyslipidemia 11/09/2020   Essential hypertension 11/09/2020    -Reassurance is given.  Anticipate continued progress with activity, avoiding any heavy lifting for the next 4 weeks.  Expect improvement in diminishment in abdominal tenderness in this interval.  Follow-up in 1 month.   Ronny Bacon M.D., FACS 01/26/2022, 1:43 PM

## 2022-01-26 NOTE — Patient Instructions (Addendum)
If you have any concerns or questions, please feel free to call our office. See follow up appointment below.  ? ? ?GENERAL POST-OPERATIVE ?PATIENT INSTRUCTIONS  ? ?WOUND CARE INSTRUCTIONS:  Keep a dry clean dressing on the wound if there is drainage. The initial bandage may be removed after 24 hours.  Once the wound has quit draining you may leave it open to air.  If clothing rubs against the wound or causes irritation and the wound is not draining you may cover it with a dry dressing during the daytime.  Try to keep the wound dry and avoid ointments on the wound unless directed to do so.  If the wound becomes bright red and painful or starts to drain infected material that is not clear, please contact your physician immediately.  If the wound is mildly pink and has a thick firm ridge underneath it, this is normal, and is referred to as a healing ridge.  This will resolve over the next 4-6 weeks. ? ?BATHING: ?You may shower if you have been informed of this by your surgeon. However, Please do not submerge in a tub, hot tub, or pool until incisions are completely sealed or have been told by your surgeon that you may do so. ? ?DIET:  You may eat any foods that you can tolerate.  It is a good idea to eat a high fiber diet and take in plenty of fluids to prevent constipation.  If you do become constipated you may want to take a mild laxative or take ducolax tablets on a daily basis until your bowel habits are regular.  Constipation can be very uncomfortable, along with straining, after recent surgery. ? ?ACTIVITY:  You are encouraged to cough and deep breath or use your incentive spirometer if you were given one, every 15-30 minutes when awake.  This will help prevent respiratory complications and low grade fevers post-operatively if you had a general anesthetic.  You may want to hug a pillow when coughing and sneezing to add additional support to the surgical area, if you had abdominal or chest surgery, which will  decrease pain during these times.  You are encouraged to walk and engage in light activity for the next two weeks.  You should not lift more than 20 pounds for 6 weeks total after surgery as it could put you at increased risk for complications.  Twenty pounds is roughly equivalent to a plastic bag of groceries. At that time- Listen to your body when lifting, if you have pain when lifting, stop and then try again in a few days. Soreness after doing exercises or activities of daily living is normal as you get back in to your normal routine. ? ?MEDICATIONS:  Try to take narcotic medications and anti-inflammatory medications, such as tylenol, ibuprofen, naprosyn, etc., with food.  This will minimize stomach upset from the medication.  Should you develop nausea and vomiting from the pain medication, or develop a rash, please discontinue the medication and contact your physician.  You should not drive, make important decisions, or operate machinery when taking narcotic pain medication. ? ?SUNBLOCK ?Use sun block to incision area over the next year if this area will be exposed to sun. This helps decrease scarring and will allow you avoid a permanent darkened area over your incision. ? ?QUESTIONS:  Please feel free to call our office if you have any questions, and we will be glad to assist you. [phone] ? ? ?

## 2022-02-11 ENCOUNTER — Other Ambulatory Visit: Payer: Self-pay | Admitting: Cardiology

## 2022-02-11 DIAGNOSIS — E785 Hyperlipidemia, unspecified: Secondary | ICD-10-CM

## 2022-02-17 ENCOUNTER — Ambulatory Visit: Payer: BC Managed Care – PPO | Admitting: Behavioral Health

## 2022-02-18 ENCOUNTER — Ambulatory Visit (INDEPENDENT_AMBULATORY_CARE_PROVIDER_SITE_OTHER): Payer: BC Managed Care – PPO | Admitting: Surgery

## 2022-02-18 ENCOUNTER — Other Ambulatory Visit: Payer: Self-pay

## 2022-02-18 ENCOUNTER — Encounter: Payer: Self-pay | Admitting: Surgery

## 2022-02-18 VITALS — BP 169/92 | HR 92 | Temp 97.8°F | Ht 71.0 in | Wt 232.0 lb

## 2022-02-18 DIAGNOSIS — Z09 Encounter for follow-up examination after completed treatment for conditions other than malignant neoplasm: Secondary | ICD-10-CM

## 2022-02-18 DIAGNOSIS — K4020 Bilateral inguinal hernia, without obstruction or gangrene, not specified as recurrent: Secondary | ICD-10-CM

## 2022-02-18 DIAGNOSIS — K420 Umbilical hernia with obstruction, without gangrene: Secondary | ICD-10-CM

## 2022-02-18 DIAGNOSIS — Z8719 Personal history of other diseases of the digestive system: Secondary | ICD-10-CM

## 2022-02-18 NOTE — Patient Instructions (Signed)

## 2022-02-18 NOTE — Progress Notes (Signed)
[facility] POST-OP OFFICE VISIT  02/18/2022  HPI: Edward [name] is a 56 y.o. male 35 days s/p robotic bilateral inguinal hernia repairs, with umbilical hernia repair.  Reports the umbilical site tenderness and pain has diminished significantly.  Denies any new bulges in either groin, having increasing confidence that lifting things again.  Reports still somewhat uncomfortable when bending over.  Vital signs: BP (!) 169/92   Pulse 92   Temp 97.8 F (36.6 C) (Oral)   Ht '5\' 11"'$  (1.803 m)   Wt 232 lb (105.2 kg)   SpO2 96%   BMI 32.36 kg/m    Physical Exam: Constitutional: Appears well Abdomen: Soft nontender.  The umbilicus is concave, nontender. Skin: Incisions well-healed, groin soft nontender without evidence of bulge or mass.  Assessment/Plan: This is a 56 y.o. male 35 days s/p robotic bilateral inguinal hernia repairs and umbilical hernia repair.  Doing well.  Patient Active Problem List   Diagnosis Date Noted   Status post laparoscopic hernia repair 01/26/2022   Non-recurrent bilateral inguinal hernia without obstruction or gangrene 12/03/2021   Overweight 11/09/2020   Dyslipidemia 11/09/2020   Essential hypertension 11/09/2020    -May follow-up as needed.  May resume full activity as confidence resumes.   Ronny Bacon M.D., FACS 02/18/2022, 3:07 PM

## 2022-02-22 ENCOUNTER — Ambulatory Visit: Payer: BC Managed Care – PPO | Admitting: Behavioral Health

## 2022-02-25 ENCOUNTER — Ambulatory Visit: Payer: BC Managed Care – PPO | Admitting: Behavioral Health

## 2022-03-01 ENCOUNTER — Encounter: Payer: Self-pay | Admitting: Behavioral Health

## 2022-03-01 ENCOUNTER — Ambulatory Visit (INDEPENDENT_AMBULATORY_CARE_PROVIDER_SITE_OTHER): Payer: BC Managed Care – PPO | Admitting: Behavioral Health

## 2022-03-01 DIAGNOSIS — F411 Generalized anxiety disorder: Secondary | ICD-10-CM | POA: Diagnosis not present

## 2022-03-01 DIAGNOSIS — F331 Major depressive disorder, recurrent, moderate: Secondary | ICD-10-CM

## 2022-03-01 MED ORDER — ALPRAZOLAM 0.5 MG PO TABS: 0.5000 mg | ORAL_TABLET | Freq: Three times a day (TID) | ORAL | 2 refills | Status: AC | PRN

## 2022-03-01 MED ORDER — BUSPIRONE HCL 10 MG PO TABS: 10.0000 mg | ORAL_TABLET | Freq: Two times a day (BID) | ORAL | 1 refills | Status: DC

## 2022-03-23 ENCOUNTER — Other Ambulatory Visit: Payer: Self-pay | Admitting: Behavioral Health

## 2022-03-23 DIAGNOSIS — F411 Generalized anxiety disorder: Secondary | ICD-10-CM

## 2022-04-09 ENCOUNTER — Other Ambulatory Visit: Payer: Self-pay | Admitting: Internal Medicine

## 2022-04-26 ENCOUNTER — Ambulatory Visit: Payer: BC Managed Care – PPO | Admitting: Behavioral Health

## 2022-07-04 NOTE — Progress Notes (Unsigned)
Cardiology Office Note   Date:  07/06/2022   ID:  Edward [name], DOB [birth year], MRN [id number]  PCP:  [name], MD  Cardiologist:   None Referring:  [name], MD  Chief Complaint  Patient presents with   Elevated coronary calcium       History of Present Illness: Edward [name] is a 56 y.o. male who was referred for evaluation of HTN by Edward, Jenetta Downer, MD.  The patient has also had a history of chest discomfort.  He was followed by a cardiologist in [place] but switched care.  He reports having a stress test about 10 years ago that was apparently negative for any evidence of ischemia.  I was able to review some notes from her previous cardiology appointment in [place].  He had normal left ventricular ejection fraction.  There were no significant abnormalities on this echo.  This was from September 2021.   He did have an elevated coronary calcium score.  This was 73 which was 85 percentile.  However, he had a negative follow-up POET (Plain Old Exercise Treadmill)  He did have a sleep study and was found to have sleep apnea and is now wearing CPAP.  Feels better with this.  Since I saw him he has had no new cardiovascular complaints.  He has had inguinal hernia repair robotically.  He did well with this.  He is unfortunately limited in his activities by neuropathy.  However, with what he can do which is pedal a bicycle for 15 minutes he is not describing chest pressure, neck or arm discomfort.  He is having no new shortness of breath, PND or orthopnea.  He had no palpitations, presyncope or syncope.  He occasionally has some dizziness that is very brief and can happen when he is seated and sounds vertiginous.    Past Medical History:  Diagnosis Date   Anxiety    Barrett's esophagus    Charcot-Marie-Tooth disease    Claustrophobia    Elevated liver enzymes    GERD (gastroesophageal reflux disease)    Hiatal hernia    History of stress  test    Hyperlipidemia    Hypertension    OSA on CPAP    Pre-diabetes     Past Surgical History:  Procedure Laterality Date   COLONOSCOPY     ESOPHAGOGASTRODUODENOSCOPY ENDOSCOPY     INSERTION OF MESH  01/13/2022   Procedure: INSERTION OF MESH;  Surgeon: Ronny Bacon, MD;  Location: [facility] ORS;  Service: General;;   VASECTOMY     WISDOM TOOTH EXTRACTION       Current Outpatient Medications  Medication Sig Dispense Refill   ALPRAZolam (XANAX) 0.5 MG tablet Take 1 tablet (0.5 mg total) by mouth 3 (three) times daily as needed. 90 tablet 2   amLODipine (NORVASC) 2.5 MG tablet TAKE 1 TABLET BY MOUTH AT BEDTIME. 90 tablet 3   Ascorbic Acid (VITAMIN C PO) Take 1 tablet by mouth daily.     atorvastatin (LIPITOR) 80 MG tablet TAKE 1 TABLET BY MOUTH EVERY DAY 90 tablet 3   famotidine (PEPCID) 20 MG tablet Take 1 tablet (20 mg total) by mouth at bedtime. 90 tablet 3   gabapentin (NEURONTIN) 100 MG capsule Take 100 mg by mouth 3 (three) times daily.     MEGARED OMEGA-3 KRILL OIL PO Take 1 capsule by mouth daily.     omeprazole (PRILOSEC) 40 MG capsule TAKE 1 CAPSULE BY MOUTH EVERY DAY 90  capsule 1   valsartan-hydrochlorothiazide (DIOVAN HCT) 160-12.5 MG tablet Take 1 tablet by mouth daily. (Patient taking differently: Take 1 tablet by mouth every morning.) 90 tablet 3   VITAMIN D PO Take 1 tablet by mouth 2 (two) times a week.     zinc gluconate 50 MG tablet Take 50 mg by mouth 2 (two) times a week.     busPIRone (BUSPAR) 10 MG tablet Take 1 tablet (10 mg total) by mouth 2 (two) times daily. 60 tablet 1   ibuprofen (ADVIL) 800 MG tablet Take 1 tablet (800 mg total) by mouth every 8 (eight) hours as needed. 30 tablet 0   No current facility-administered medications for this visit.    Allergies:   Patient has no known allergies.    ROS:  Please see the history of present illness.   Otherwise, review of systems are positive for none.   All other systems are reviewed and negative.     PHYSICAL EXAM: VS:  BP (!) 136/90 (BP Location: Left Arm, Patient Position: Sitting, Cuff Size: Large)   Pulse 76   Ht '5\' 11"'$  (1.803 m)   Wt 235 lb 9.6 oz (106.9 kg)   SpO2 97%   BMI 32.86 kg/m  , BMI Body mass index is 32.86 kg/m. GENERAL:  Well appearing NECK:  No jugular venous distention, waveform within normal limits, carotid upstroke brisk and symmetric, no bruits, no thyromegaly LUNGS:  Clear to auscultation bilaterally CHEST:  Unremarkable HEART:  PMI not displaced or sustained,S1 and S2 within normal limits, no S3, no S4, no clicks, no rubs, no murmurs ABD:  Flat, positive bowel sounds normal in frequency in pitch, no bruits, no rebound, no guarding, no midline pulsatile mass, no hepatomegaly, no splenomegaly EXT:  2 plus pulses throughout, no edema, no cyanosis no clubbing   EKG:  EKG is  ordered today. Sinus rhythm, rate 76, poor anterior R wave progression, axis within normal limits, intervals within normal limits, no acute ST-T wave changes.  Recent Labs: 10/19/2021: ALT 76; BUN 13; Creatinine, Ser 0.78; Hemoglobin 16.2; Platelets 205; Potassium 3.9; Sodium 138    Lipid Panel    Component Value Date/Time   CHOL 198 11/10/2020 1132   TRIG 132 11/10/2020 1132   HDL 73 11/10/2020 1132   CHOLHDL 2.7 11/10/2020 1132   LDLCALC 102 (H) 11/10/2020 1132      Wt Readings from Last 3 Encounters:  07/06/22 235 lb 9.6 oz (106.9 kg)  02/18/22 232 lb (105.2 kg)  01/26/22 230 lb 3.2 oz (104.4 kg)      Other studies Reviewed: Additional studies/ records that were reviewed today include:   None Review of the above records demonstrates:  NA   ASSESSMENT AND PLAN:  HTN:   His blood pressure is controlled.  No change in therapy.  DYSLIPIDEMIA:   I did increase his statin after his calcium score.  His LDL was 102.  I have not seen a repeat and have given him written instructions to get this with the results sent to me.  ELEVATED CORONARY CALCIUM:   This was 85 and  73rd percentile.  He had a negative POET (Plain Old Exercise Treadmill)   SLEEP APNEA: He feels better on CPAP.  No change in therapy.    Current medicines are reviewed at length with the patient today.  The patient does not have concerns regarding medicines.  The following changes have been made:   None  Labs/ tests ordered today include: None  Orders Placed This Encounter  Procedures   EKG 12-Lead     Disposition:   FU with me as needed.    Signed, Minus Breeding, MD  07/06/2022 5:08 PM    [place]

## 2022-07-06 ENCOUNTER — Ambulatory Visit: Payer: BC Managed Care – PPO | Admitting: Cardiology

## 2022-07-06 ENCOUNTER — Ambulatory Visit: Payer: BC Managed Care – PPO | Attending: Cardiology | Admitting: Cardiology

## 2022-07-06 ENCOUNTER — Encounter: Payer: Self-pay | Admitting: Cardiology

## 2022-07-06 VITALS — BP 136/90 | HR 76 | Ht 71.0 in | Wt 235.6 lb

## 2022-07-06 DIAGNOSIS — I10 Essential (primary) hypertension: Secondary | ICD-10-CM | POA: Diagnosis not present

## 2022-07-06 DIAGNOSIS — R931 Abnormal findings on diagnostic imaging of heart and coronary circulation: Secondary | ICD-10-CM

## 2022-07-06 DIAGNOSIS — G4719 Other hypersomnia: Secondary | ICD-10-CM

## 2022-07-06 NOTE — Patient Instructions (Signed)
Medication Instructions:  No changes *If you need a refill on your cardiac medications before your next appointment, please call your pharmacy*   Lab Work: Please  have a fasting Lipid lab completed and send to Dr. Warren Lacy [phone])  If you have labs (blood work) drawn today and your tests are completely normal, you will receive your results only by: Rudy (if you have MyChart) OR A paper copy in the mail If you have any lab test that is abnormal or we need to change your treatment, we will call you to review the results.   Testing/Procedures: None ordered   Follow-Up: At [facility], you and your health needs are our priority.  As part of our continuing mission to provide you with exceptional heart care, we have created designated Provider Care Teams.  These Care Teams include your primary Cardiologist (physician) and Advanced Practice Providers (APPs -  Physician Assistants and Nurse Practitioners) who all work together to provide you with the care you need, when you need it.  We recommend signing up for the patient portal called "MyChart".  Sign up information is provided on this After Visit Summary.  MyChart is used to connect with patients for Virtual Visits (Telemedicine).  Patients are able to view lab/test results, encounter notes, upcoming appointments, etc.  Non-urgent messages can be sent to your provider as well.   To learn more about what you can do with MyChart, go to [email or web address].    Your next appointment:   Follow as needed with Dr. Percival Spanish  Important Information About Sugar

## 2022-07-12 ENCOUNTER — Encounter: Payer: Self-pay | Admitting: Cardiology

## 2022-07-25 ENCOUNTER — Other Ambulatory Visit: Payer: Self-pay | Admitting: Cardiology

## 2022-08-12 IMAGING — CT CT CARDIAC CORONARY ARTERY CALCIUM SCORE
3 series · 14 of 20 positions shown, 15 images · non-contrast
Comparison: None.
COMPARISON: None.

Addendum:
EXAM:
OVER-READ INTERPRETATION  CT CHEST

The following report is an over-read performed by radiologist Dr.
Najy Tiger [[email or web address]] on 02/12/2021. This over-read
does not include interpretation of cardiac or coronary anatomy or
pathology. The coronary CTA interpretation by the cardiologist is
attached.
CLINICAL DATA: Cardiovascular Disease Risk stratification
Coronary Calcium Score
TECHNIQUE: A gated, non-contrast computed tomography scan of the heart was
performed using 3mm slice thickness. Axial images were analyzed on a
dedicated workstation. Calcium scoring of the coronary arteries was
performed using the Agatston method.

[Series 2: casc 3.0 bv41 2 bestsyst 41 % · axial · 0.46mm/px · z∈[-236,-167]mm · 4 of 39 slices shown, 5 images]
[im 8/39  vessel]
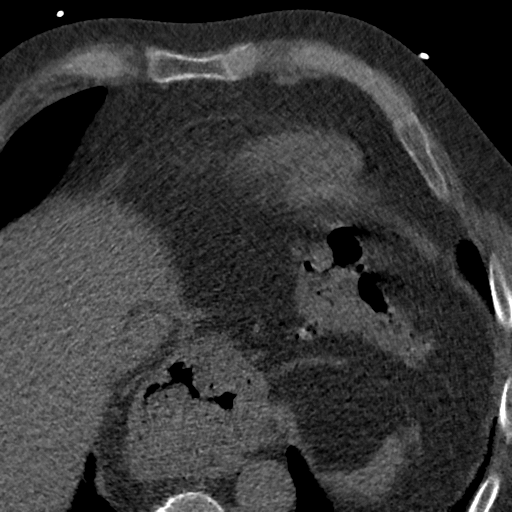
[im 8/39  lung]
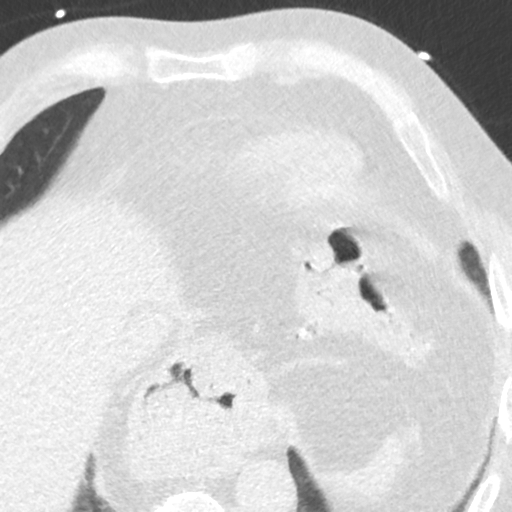
[im 16/39  vessel]
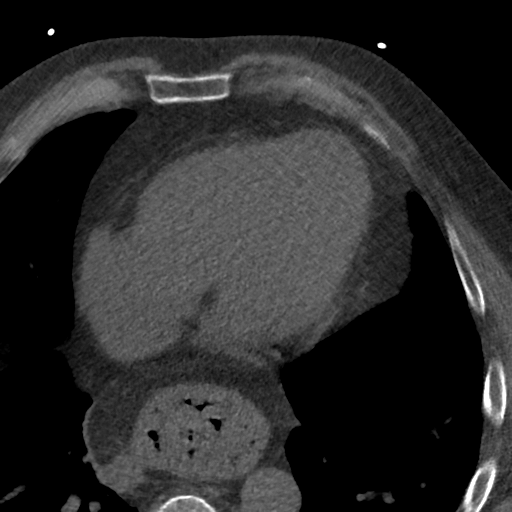
[im 23/39  vessel]
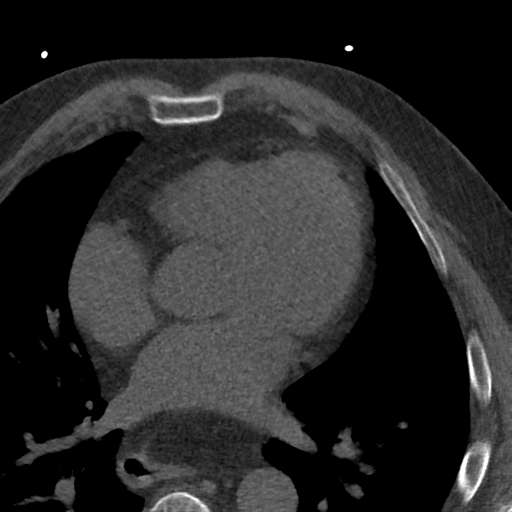
[im 31/39  vessel]
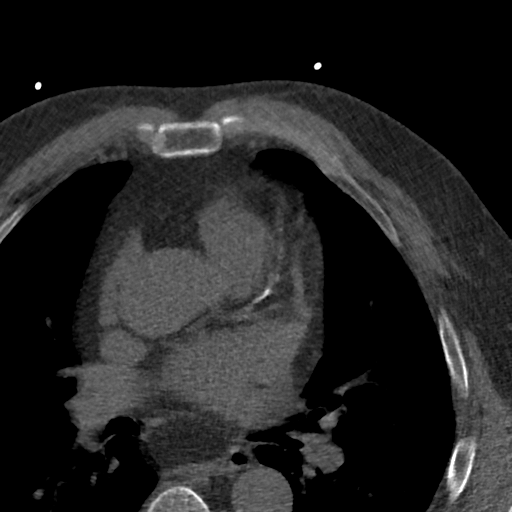

[Series 3: lung 41 % · axial · 0.75mm/px · z∈[-239,-164]mm · 5 of 39 slices shown]
[im 7/39  lung]
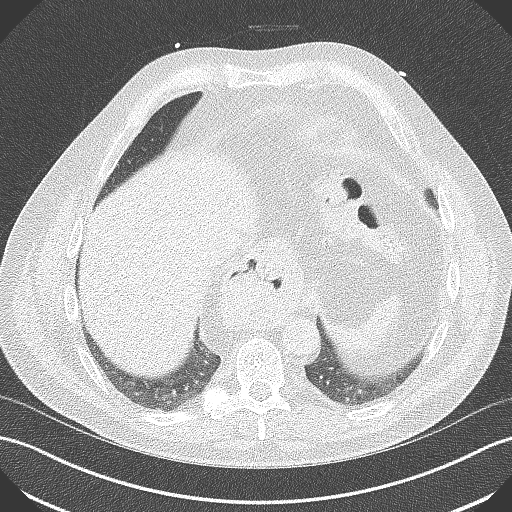
[im 13/39  lung]
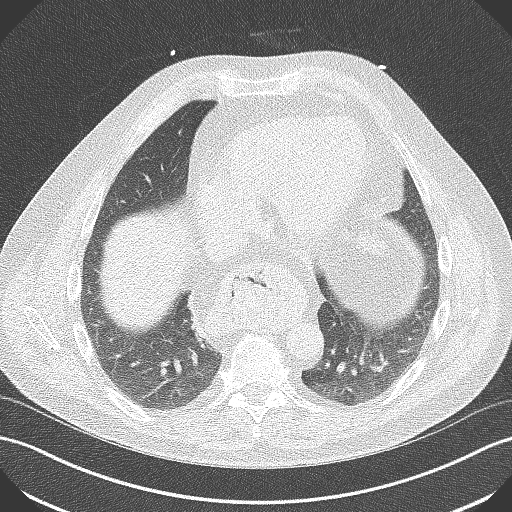
[im 20/39  lung]
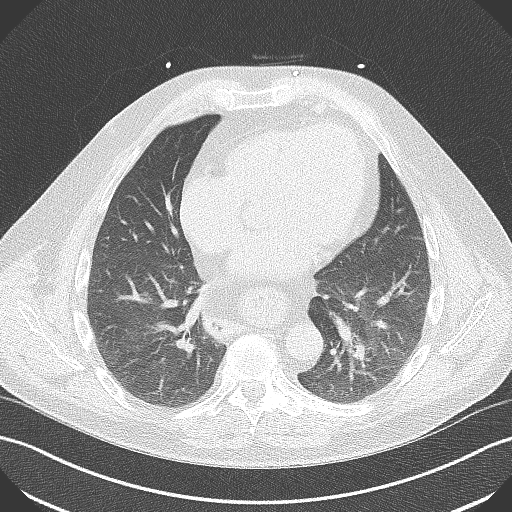
[im 26/39  lung]
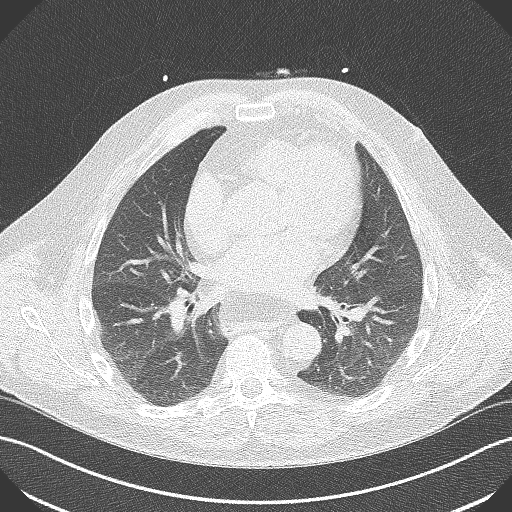
[im 32/39  lung]
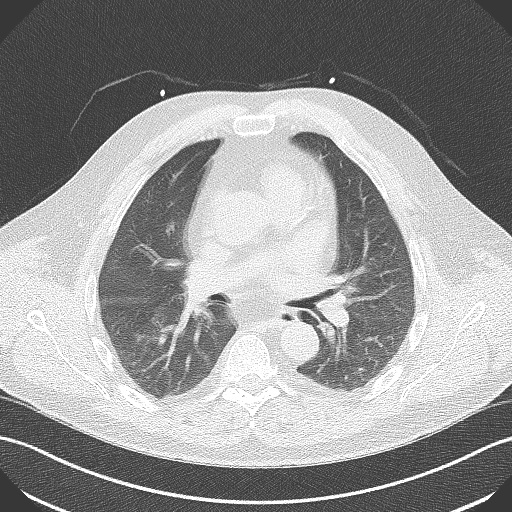

[Series 4: lung st 41 % · axial · 0.75mm/px · z∈[-239,-164]mm · 5 of 39 slices shown]
[im 7/39  lung]
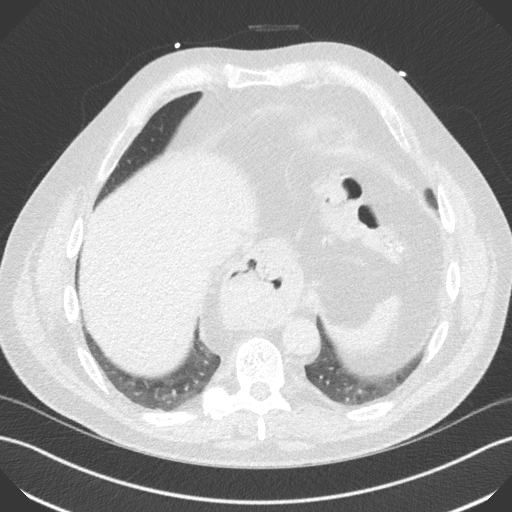
[im 13/39  lung]
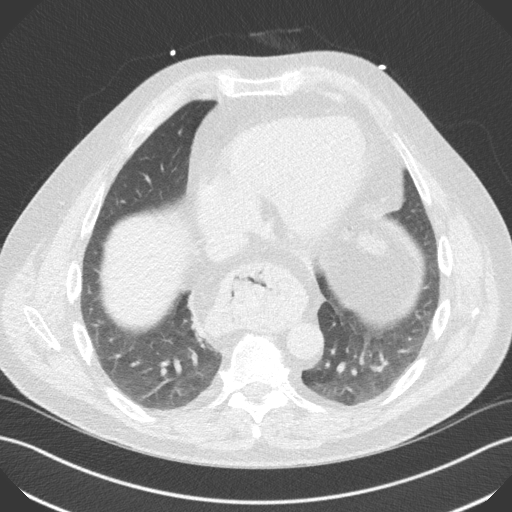
[im 20/39  lung]
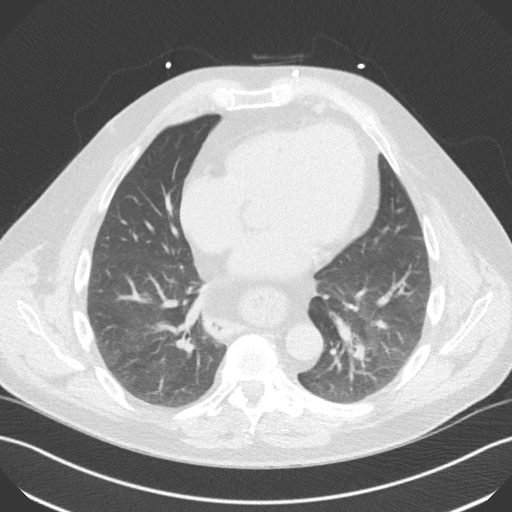
[im 26/39  lung]
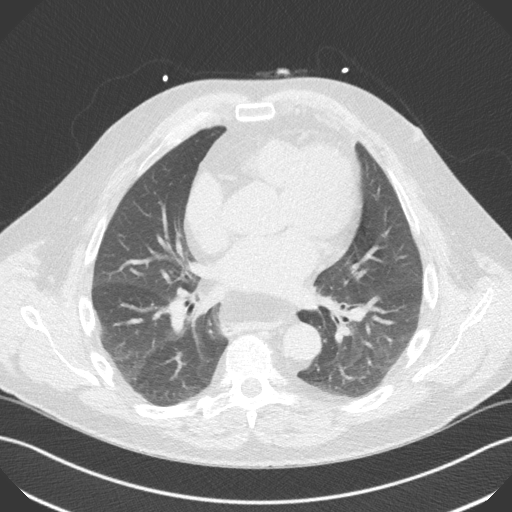
[im 32/39  lung]
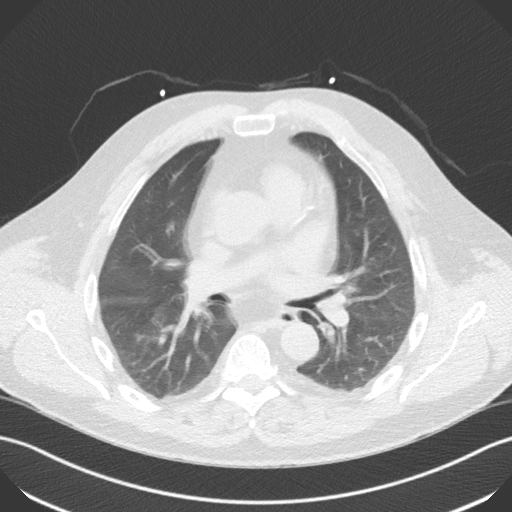

[14 of 20 positions shown; findings below may reference images not displayed]

FINDINGS: Vascular: Heart is normal size.  Aorta normal caliber.

Mediastinum/Nodes: No adenopathy.  Large hiatal hernia.

Lungs/Pleura: No confluent opacities or effusions.

Upper Abdomen: Imaging into the upper abdomen demonstrates no acute
findings.

Musculoskeletal: Chest wall soft tissues are unremarkable. No acute
bony abnormality.
IMPRESSION: Large hiatal hernia.

No acute extra cardiac abnormality.
FINDINGS: Coronary arteries: Normal origins.

Coronary Calcium Score:

Left main: 0

Left anterior descending artery: 76

Left circumflex artery: 9

Right coronary artery: 0

Total: 85

Percentile: 73

Pericardium: Normal.

Ascending Aorta: Normal caliber.

Non-cardiac: See separate report from [REDACTED].
IMPRESSION: Coronary calcium score of 85. This was 73 percentile for age-,
race-, and sex-matched controls.



If CAC=0, it is reasonable to withhold statin therapy and reassess
in 5 to 10 years, as long as higher risk conditions are absent
(diabetes mellitus, family history of premature CHD in first degree
relatives (males <55 years; females <65 years), cigarette smoking,
or LDL >=190 mg/dL).

If CAC is 1 to 99, it is reasonable to initiate statin therapy for
patients >=55 years of age.

If CAC is >=100 or >=75th percentile, it is reasonable to initiate
statin therapy at any age.

Cardiology referral should be considered for patients with CAC
scores >=400 or >=75th percentile.

*3435 AHA/ACC/AACVPR/AAPA/ABC/ROA GIL/VENUGOPAL/DIANSARI/Schmid/AYANYEMI/BENEDETTO/SHUKURA
Guideline on the Management of Blood Cholesterol: A Report of the
American College of Cardiology/American Heart Association Task Force
on Clinical Practice Guidelines. J Am Coll Cardiol.
2328;73(24):1658-1150.

*** End of Addendum ***
EXAM:
OVER-READ INTERPRETATION  CT CHEST

The following report is an over-read performed by radiologist Dr.
Najy Tiger [REDACTED] on 02/12/2021. This over-read
does not include interpretation of cardiac or coronary anatomy or
pathology. The coronary CTA interpretation by the cardiologist is
attached.
FINDINGS: Vascular: Heart is normal size.  Aorta normal caliber.

Mediastinum/Nodes: No adenopathy.  Large hiatal hernia.

Lungs/Pleura: No confluent opacities or effusions.

Upper Abdomen: Imaging into the upper abdomen demonstrates no acute
findings.

Musculoskeletal: Chest wall soft tissues are unremarkable. No acute
bony abnormality.
IMPRESSION: Large hiatal hernia.

No acute extra cardiac abnormality.

## 2022-09-28 ENCOUNTER — Other Ambulatory Visit: Payer: Self-pay | Admitting: Internal Medicine

## 2023-02-03 ENCOUNTER — Other Ambulatory Visit: Payer: Self-pay | Admitting: Cardiology

## 2023-02-03 DIAGNOSIS — E785 Hyperlipidemia, unspecified: Secondary | ICD-10-CM

## 2023-02-04 ENCOUNTER — Other Ambulatory Visit: Payer: Self-pay | Admitting: Cardiology

## 2023-02-05 ENCOUNTER — Other Ambulatory Visit: Payer: Self-pay | Admitting: Internal Medicine

## 2023-02-26 ENCOUNTER — Other Ambulatory Visit: Payer: Self-pay | Admitting: Cardiology

## 2023-02-26 DIAGNOSIS — E785 Hyperlipidemia, unspecified: Secondary | ICD-10-CM

## 2023-03-02 ENCOUNTER — Other Ambulatory Visit: Payer: Self-pay | Admitting: Cardiology

## 2023-03-02 DIAGNOSIS — E785 Hyperlipidemia, unspecified: Secondary | ICD-10-CM

## 2023-03-26 ENCOUNTER — Other Ambulatory Visit: Payer: Self-pay | Admitting: Cardiology

## 2023-03-26 DIAGNOSIS — E785 Hyperlipidemia, unspecified: Secondary | ICD-10-CM

## 2023-04-12 ENCOUNTER — Other Ambulatory Visit: Payer: Self-pay | Admitting: Internal Medicine

## 2023-04-18 ENCOUNTER — Other Ambulatory Visit: Payer: Self-pay | Admitting: Internal Medicine

## 2023-04-18 IMAGING — CT CT ABD-PELV W/ CM
2 of 5 series · 16 of 46 positions shown, 18 images · IV contrast (APPLIED)
Comparison: None.

CLINICAL DATA: Left lower quadrant pain

EXAM:
CT ABDOMEN AND PELVIS WITH CONTRAST
TECHNIQUE: Multidetector CT imaging of the abdomen and pelvis was performed
using the standard protocol following bolus administration of
intravenous contrast.

[Series 2: abd pel w · axial · 0.89mm/px · z∈[-319,+196]mm · 13 of 117 slices shown, 15 images]
[im 7/117  soft-tissue]
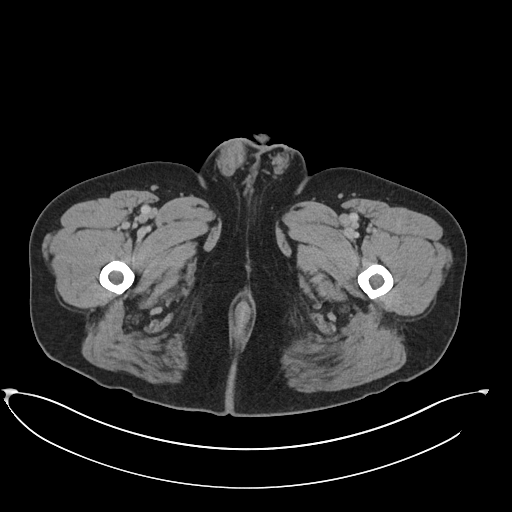
[im 7/117  bone]
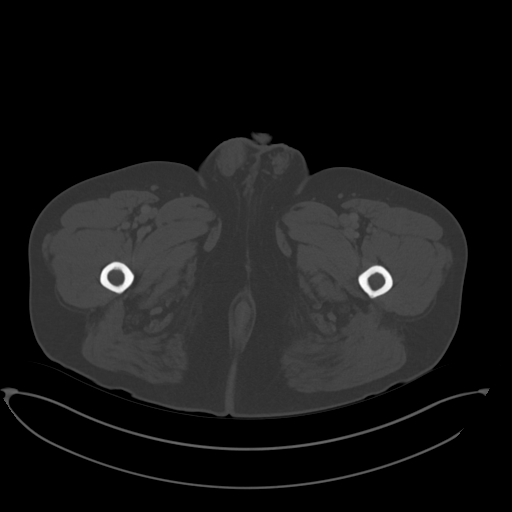
[im 13/117  soft-tissue]
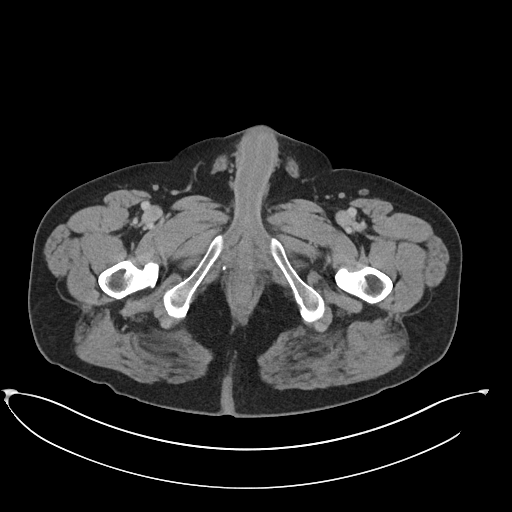
[im 26/117  soft-tissue]
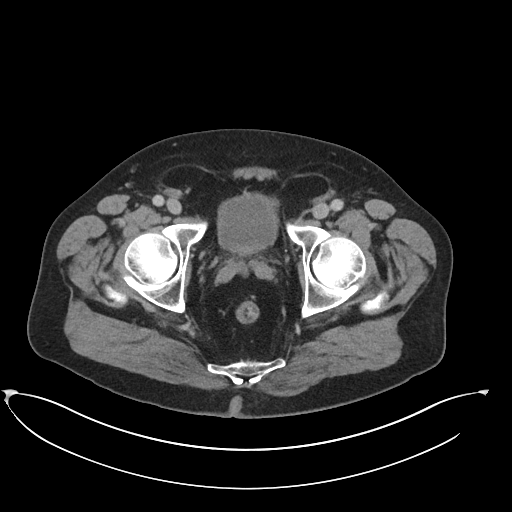
[im 33/117  soft-tissue]
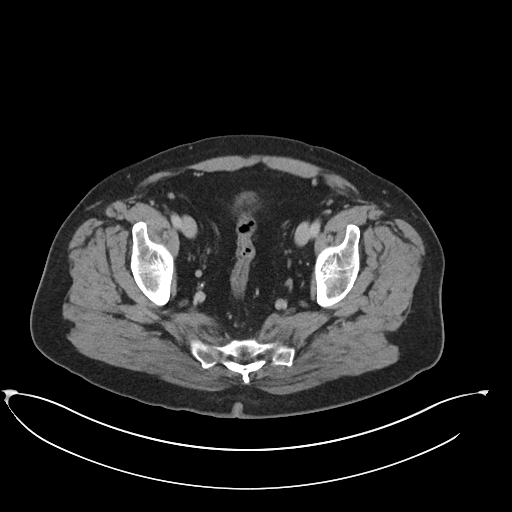
[im 39/117  soft-tissue]
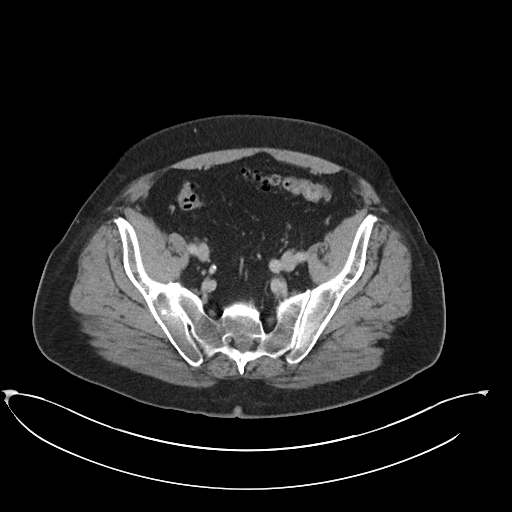
[im 52/117  soft-tissue]
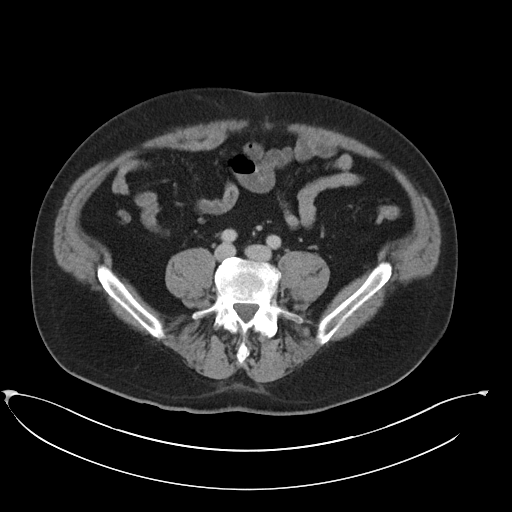
[im 59/117  soft-tissue]
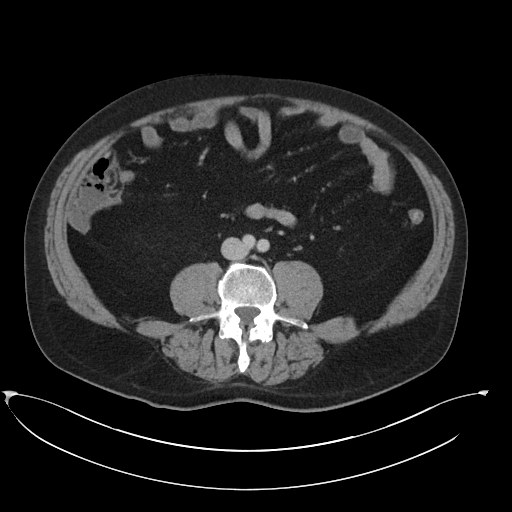
[im 65/117  soft-tissue]
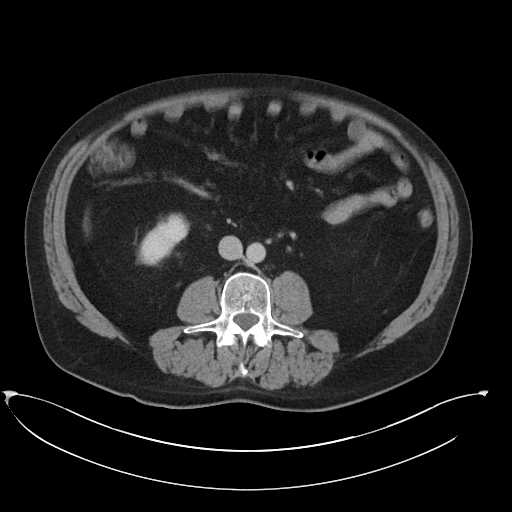
[im 78/117  soft-tissue]
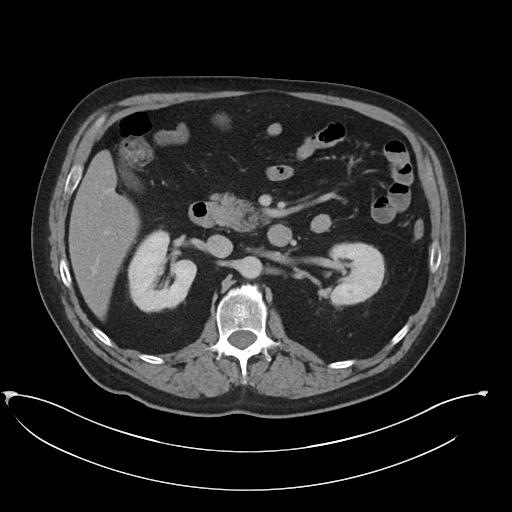
[im 78/117  bone]
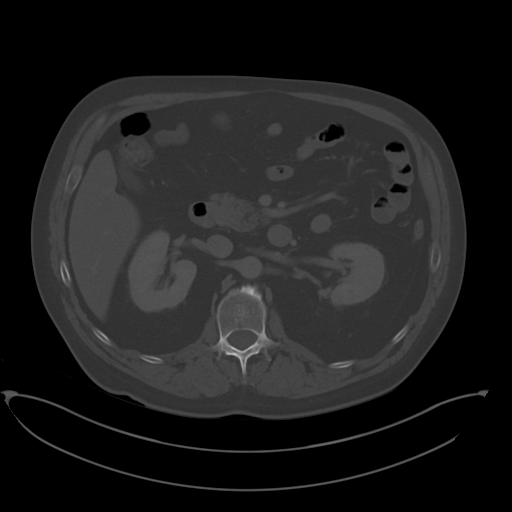
[im 84/117  soft-tissue]
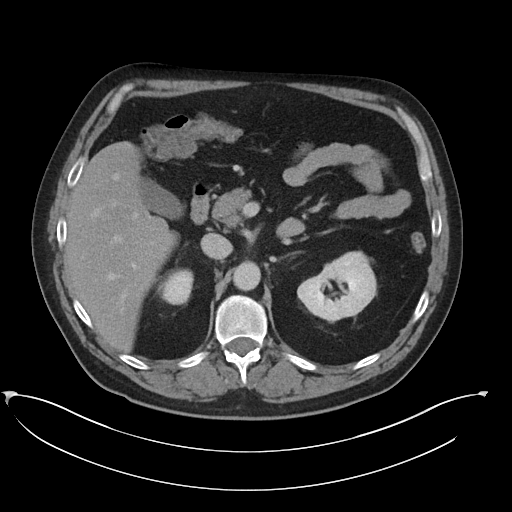
[im 91/117  soft-tissue]
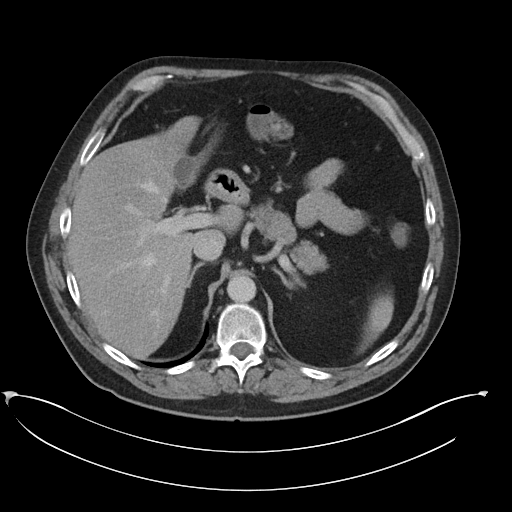
[im 104/117  soft-tissue]
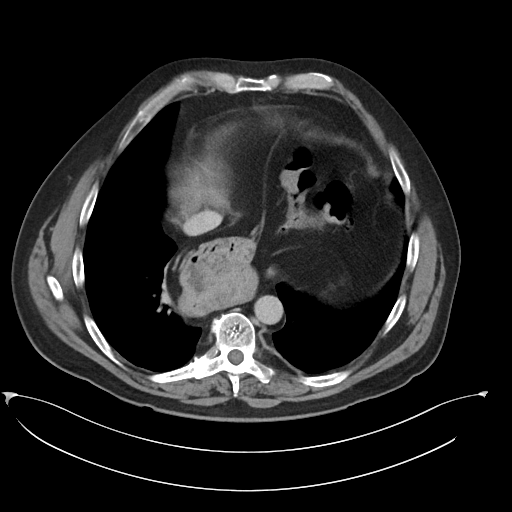
[im 110/117  soft-tissue]
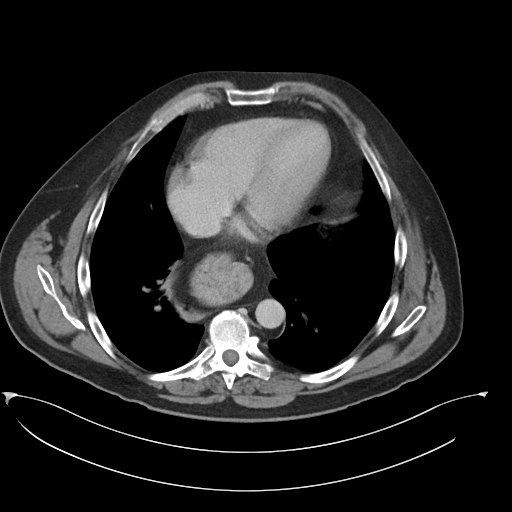

[Series 5: coronal · coronal · 0.85mm/px · 3 of 112 slices shown]
[im 38/112  soft-tissue]
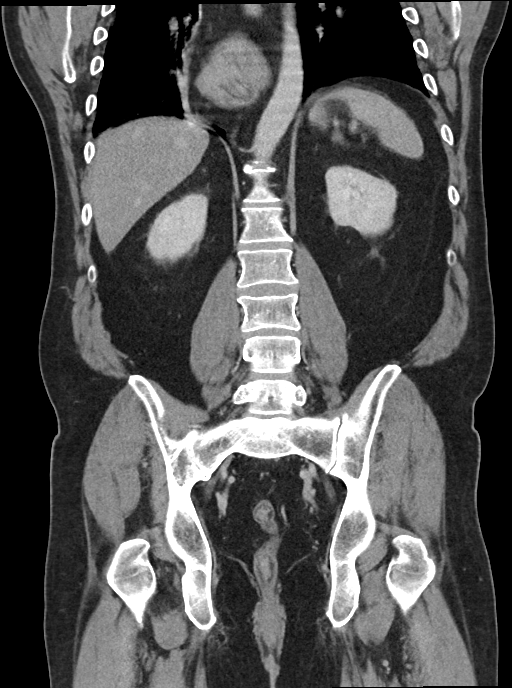
[im 50/112  soft-tissue]
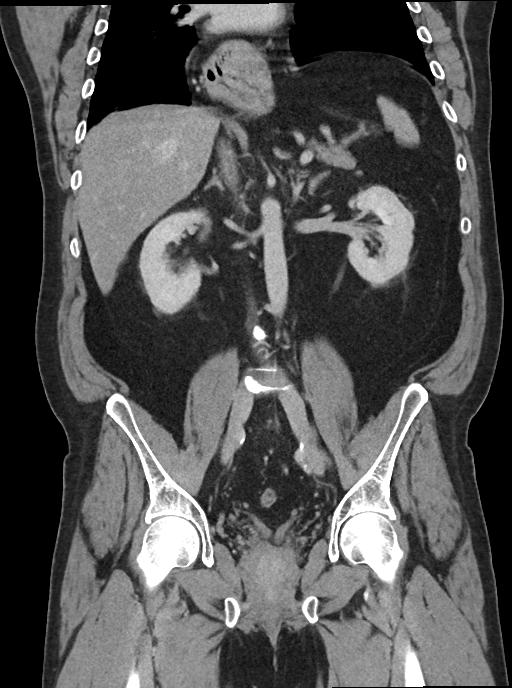
[im 62/112  soft-tissue]
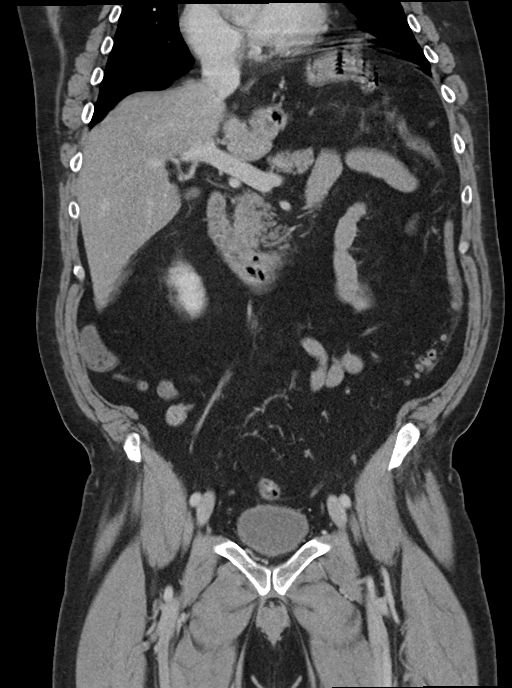

[16 of 46 positions shown; findings below may reference images not displayed]

RADIATION DOSE REDUCTION: This exam was performed according to the
departmental dose-optimization program which includes automated
exposure control, adjustment of the mA and/or kV according to
patient size and/or use of iterative reconstruction technique.

CONTRAST:  100mL OMNIPAQUE IOHEXOL 300 MG/ML  SOLN
FINDINGS: Lower chest: Moderate-sized hiatal hernia.  No acute abnormality.

Hepatobiliary: Mild diffuse low-density throughout the liver
compatible with fatty infiltration. Small low-density lesion
adjacent to the falciform ligament measures 1.6 cm most compatible
with small cyst. Gallbladder unremarkable.

Pancreas: No focal abnormality or ductal dilatation.

Spleen: No focal abnormality.  Normal size.

Adrenals/Urinary Tract: No adrenal abnormality. No focal renal
abnormality. No stones or hydronephrosis. Urinary bladder is
unremarkable.

Stomach/Bowel: Scattered colonic diverticulosis. No active
diverticulitis. Normal appendix. Stomach and small bowel
decompressed, unremarkable.

Vascular/Lymphatic: No evidence of aneurysm or adenopathy.

Reproductive: No visible focal abnormality.

Other: No free fluid or free air. Bilateral small inguinal hernias
containing fat.

Musculoskeletal: No acute bony abnormality.
IMPRESSION: No acute findings.

Moderate-sized hiatal hernia.

Hepatic steatosis.

Colonic diverticulosis.

## 2023-04-19 ENCOUNTER — Other Ambulatory Visit: Payer: Self-pay | Admitting: Internal Medicine

## 2023-04-19 ENCOUNTER — Telehealth: Payer: Self-pay | Admitting: Internal Medicine

## 2023-04-19 MED ORDER — OMEPRAZOLE 40 MG PO CPDR: 40.0000 mg | DELAYED_RELEASE_CAPSULE | Freq: Every day | ORAL | 1 refills | Status: DC

## 2023-04-19 NOTE — Telephone Encounter (Signed)
Patient called requesting a refill on Omeprazole only has one day left of medication asking if he can get refilled until his appt.

## 2023-04-19 NOTE — Telephone Encounter (Signed)
Rx sent 

## 2023-05-02 LAB — LAB REPORT - SCANNED

## 2023-05-13 ENCOUNTER — Other Ambulatory Visit: Payer: Self-pay | Admitting: Internal Medicine

## 2023-06-30 ENCOUNTER — Ambulatory Visit: Payer: BC Managed Care – PPO | Admitting: Gastroenterology

## 2023-06-30 ENCOUNTER — Encounter: Payer: Self-pay | Admitting: Gastroenterology

## 2023-06-30 VITALS — BP 120/90 | HR 90 | Ht 71.0 in | Wt 229.0 lb

## 2023-06-30 DIAGNOSIS — K2270 Barrett's esophagus without dysplasia: Secondary | ICD-10-CM | POA: Diagnosis not present

## 2023-06-30 DIAGNOSIS — K219 Gastro-esophageal reflux disease without esophagitis: Secondary | ICD-10-CM | POA: Diagnosis not present

## 2023-06-30 MED ORDER — OMEPRAZOLE 40 MG PO CPDR: 40.0000 mg | DELAYED_RELEASE_CAPSULE | Freq: Every day | ORAL | 11 refills | Status: DC

## 2023-06-30 NOTE — Patient Instructions (Addendum)
_______________________________________________________  If your blood pressure at your visit was 140/90 or greater, please contact your primary care physician to follow up on this.  _______________________________________________________  If you are age 59 or older, your body mass index should be between 23-30. Your Body mass index is 31.94 kg/m. If this is out of the aforementioned range listed, please consider follow up with your Primary Care Provider.  If you are age 91 or younger, your body mass index should be between 19-25. Your Body mass index is 31.94 kg/m. If this is out of the aformentioned range listed, please consider follow up with your Primary Care Provider.   ________________________________________________________  The [place] providers would like to encourage you to use [facility] to communicate with providers for non-urgent requests or questions.  Due to long hold times on the telephone, sending your provider a message by [facility] may be a faster and more efficient way to get a response.  Please allow 48 business hours for a response.  Please remember that this is for non-urgent requests.  _______________________________________________________  We have sent the following medications to your pharmacy for you to pick up at your convenience:  Decrease: omeprazole to 40mg  one capsule once daily  You will follow up in our office in 1 year.  Thank you for entrusting me with your care and choosing [facility].  Boone Master, PA-C

## 2023-06-30 NOTE — Progress Notes (Signed)
Chief Complaint: Medication refill Primary GI MD: Dr. 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 Rhea Belton.  See his note for details.  GERD currently well-controlled.  He actually decreased his omeprazole 40 Mg to once daily with adequate control.  States since using his CPAP his nighttime symptoms have improved.  He has no complaints today.  Due for repeat EGD 08/2024  PREVIOUS GI WORKUP   EGD 08/2021 for follow-up of Barrett's - Esophageal mucosal changes suggestive of short- segment Barrett' s esophagus. Biopsied.  (Intestinal metaplasia consistent with Barrett's) - 5 cm hiatal hernia.  - Erythematous mucosa in the gastric body and antrum. Biopsied.  (Mild inflammation consistent with reflux, negative for H. pylori) - Normal examined duodenum. - Repeat 3 years (08/2024)  Colonoscopy 08/2021 for screening - The examined portion of the ileum was normal.  - One 5 mm polyp (benign colonic mucosa) in the transverse colon, removed with a cold snare. Resected and retrieved.  - Moderate diverticulosis in the sigmoid colon, in the descending colon, in the transverse colon and in the ascending colon.  - Small internal hemorrhoids. - Repeat 10 years (08/2031)  EGD performed on 08/25/2018, [facility], Dr. Pennie Banter --Mucosal changes consistent with short segment Barrett's esophagus involving the mucosa 34 to 35 cm from the incisors. Scattered islands of salmon-colored mucosa at 33 cm. Maximal extent 3 cm. Biopsied. Medium size hiatal hernia. Diffuse erythematous mucosa in the gastric antrum which was biopsied. Normal examined duodenum. Pathology: Chronic inflammation of cardia type gastric mucosa negative for Barrett's esophagus.  Squamous mucosa with changes consistent with reflux esophagitis.  Negative for dysplasia.   Gastric biopsies moderate chronic active gastritis positive for H. Pylori. --He was then treated with amoxicillin, Biaxin and Prilosec. --H. pylori stool antigen 11/09/2018 not detected; confirmation of eradication   EGD performed 10/07/2014, Dr. Earlean Polka --Mucosal changes consistent with short segment Barrett's esophagus Prague criteria C1-M3.  Biopsied.  Small hiatus hernia.  Patchy erythematous mucosa antrum.  Normal duodenum.  Pathology: Focal intestinal metaplasia consistent with Barrett's esophagus.  No dysplasia.  This was the finding at both 34 and 35 cm.  Past Medical History:  Diagnosis Date   Anxiety    Barrett's esophagus    Charcot-Marie-Tooth disease    Claustrophobia    Elevated liver enzymes    GERD (gastroesophageal reflux disease)    Hiatal hernia    History of stress test    Hyperlipidemia    Hypertension    OSA on CPAP    Pre-diabetes     Past Surgical History:  Procedure Laterality Date   COLONOSCOPY     ESOPHAGOGASTRODUODENOSCOPY ENDOSCOPY     INSERTION OF MESH  01/13/2022   Procedure: INSERTION OF MESH;  Surgeon: Campbell Lerner, MD;  Location: [facility] ORS;  Service: General;;   VASECTOMY     WISDOM TOOTH EXTRACTION      Current Outpatient Medications  Medication Sig Dispense Refill   ALPRAZolam (XANAX) 0.5 MG tablet Take 1 tablet (0.5 mg total) by mouth 3 (three) times daily as needed. 90 tablet 2   Ascorbic Acid (VITAMIN C PO) Take 1 tablet by mouth daily.     atorvastatin (LIPITOR) 80 MG tablet TAKE 1 TABLET BY MOUTH EVERY DAY 90 tablet 1   busPIRone (BUSPAR) 10 MG tablet Take 1 tablet (10 mg total) by mouth 2 (two) times daily.  60 tablet 1   gabapentin (NEURONTIN) 100 MG capsule Take 100 mg by mouth 3 (three) times daily.     ibuprofen (ADVIL) 800 MG tablet Take 1 tablet (800 mg total) by mouth every 8 (eight) hours as needed. 30 tablet 0   MEGARED OMEGA-3 KRILL OIL PO Take 1 capsule by mouth daily.     omeprazole (PRILOSEC) 40 MG capsule TAKE 1 CAPSULE (40  MG TOTAL) BY MOUTH DAILY. 90 capsule 0   valsartan-hydrochlorothiazide (DIOVAN-HCT) 160-12.5 MG tablet TAKE 1 TABLET BY MOUTH EVERY DAY 90 tablet 3   VITAMIN D PO Take 1 tablet by mouth 2 (two) times a week.     zinc gluconate 50 MG tablet Take 50 mg by mouth 2 (two) times a week.     No current facility-administered medications for this visit.    Allergies as of 06/30/2023   (No Known Allergies)    Family History  Problem Relation Age of Onset   Depression Mother    Anxiety disorder Mother    Colon polyps Mother    Diabetes Mother    Multiple sclerosis Mother    Depression Father    Anxiety disorder Father    Heart disease Father        Pacemaker   Heart attack Father    Stroke Father    Anxiety disorder Sister    Heart attack Paternal Grandfather    Anxiety disorder Paternal Grandmother    Cancer Paternal Grandmother        ?   Heart disease Other    Colon cancer Neg Hx    Esophageal cancer Neg Hx    Stomach cancer Neg Hx    Rectal cancer Neg Hx     Social History   Socioeconomic History   Marital status: Married    Spouse name: Marylene Land   Number of children: 2   Years of education: 12   Highest education level: High school graduate  Occupational History   Occupation: Retired  Tobacco Use   Smoking status: Never   Smokeless tobacco: Never  Vaping Use   Vaping status: Never Used  Substance and Sexual Activity   Alcohol use: Yes    Alcohol/week: 4.0 standard drinks of alcohol    Types: 4 Glasses of wine per week    Comment: 4 drinks  a day   Drug use: Never   Sexual activity: Yes  Other Topics Concern   Not on file  Social History Narrative   Married, two children, three grands. Lives in [place].   Social Determinants of Health   Financial Resource Strain: Not on file  Food Insecurity: Not on file  Transportation Needs: Not on file  Physical Activity: Not on file  Stress: Not on file  Social Connections: Unknown (01/19/2022)   Received  from [facility], [facility]   Social Network    Social Network: Not on file  Intimate Partner Violence: Unknown (12/11/2021)   Received from [facility], [facility]   HITS    Physically Hurt: Not on file    Insult or Talk Down To: Not on file    Threaten Physical Harm: Not on file    Scream or Curse: Not on file    Review of Systems:    Constitutional: No weight loss, fever, chills, weakness or fatigue HEENT: Eyes: No change in vision               Ears, Nose, Throat:  No change in hearing or  congestion Skin: No rash or itching Cardiovascular: No chest pain, chest pressure or palpitations   Respiratory: No SOB or cough Gastrointestinal: See HPI and otherwise negative Genitourinary: No dysuria or change in urinary frequency Neurological: No headache, dizziness or syncope Musculoskeletal: No new muscle or joint pain Hematologic: No bleeding or bruising Psychiatric: No history of depression or anxiety    Physical Exam:  Vital signs: Ht 5\' 11"  (1.803 m)   Wt 103.9 kg   BMI 31.94 kg/m   Constitutional: NAD, Well developed, Well nourished, alert and cooperative Head:  Normocephalic and atraumatic. Eyes:   PEERL, EOMI. No icterus. Conjunctiva pink. Respiratory: Respirations even and unlabored. Lungs clear to auscultation bilaterally.   No wheezes, crackles, or rhonchi.  Cardiovascular:  Regular rate and rhythm. No peripheral edema, cyanosis or pallor.  Gastrointestinal:  Soft, nondistended, nontender. No rebound or guarding. Normal bowel sounds. No appreciable masses or hepatomegaly. Rectal:  Not performed.  Msk:  Symmetrical without gross deformities. Without edema, no deformity or joint abnormality.  Neurologic:  Alert and  oriented x4;  grossly normal neurologically.  Skin:   Dry and intact without significant lesions or rashes. Psychiatric: Oriented to person, place and time. Demonstrates good judgement and reason without abnormal affect or behaviors.   RELEVANT  LABS AND IMAGING: CBC    Component Value Date/Time   WBC 8.1 10/19/2021 2036   RBC 5.14 10/19/2021 2036   HGB 16.2 10/19/2021 2036   HGB 17.4 11/10/2020 1132   HCT 48.1 10/19/2021 2036   HCT 51.0 11/10/2020 1132   PLT 205 10/19/2021 2036   PLT 208 11/10/2020 1132   MCV 93.6 10/19/2021 2036   MCV 95 11/10/2020 1132   MCH 31.5 10/19/2021 2036   MCHC 33.7 10/19/2021 2036   RDW 12.7 10/19/2021 2036   RDW 12.7 11/10/2020 1132    CMP     Component Value Date/Time   NA 138 10/19/2021 2036   K 3.9 10/19/2021 2036   CL 102 10/19/2021 2036   CO2 21 (L) 10/19/2021 2036   GLUCOSE 96 10/19/2021 2036   BUN 13 10/19/2021 2036   CREATININE 0.78 10/19/2021 2036   CALCIUM 9.9 10/19/2021 2036   PROT 7.9 10/19/2021 2036   PROT 7.3 11/10/2020 1132   ALBUMIN 4.9 10/19/2021 2036   ALBUMIN 4.9 11/10/2020 1132   AST 71 (H) 10/19/2021 2036   ALT 76 (H) 10/19/2021 2036   ALKPHOS 75 10/19/2021 2036   BILITOT 0.5 10/19/2021 2036   BILITOT 0.6 11/10/2020 1132   GFRNONAA >60 10/19/2021 2036     Assessment/Plan:   Barrett's esophagus without dysplasia Gastroesophageal reflux disease, unspecified whether esophagitis present Well-controlled on PPI daily - Refill omeprazole 40 Mg daily for 1 year - Due for repeat EGD 08/2024  CRC screening Due 2032  Boone Master, PA-C [facility] 06/30/2023, 2:39 PM  Cc: Maximiano Coss

## 2023-07-04 NOTE — Progress Notes (Signed)
Addendum: Reviewed and agree with assessment and management plan. Kadijah Shamoon M, MD  

## 2023-07-16 ENCOUNTER — Other Ambulatory Visit: Payer: Self-pay | Admitting: Cardiology

## 2023-07-27 NOTE — Progress Notes (Unsigned)
[facility] POST-OP OFFICE VISIT  07/28/2023  HPI: Edward [name] is a 57 y.o. male  s/p robotic bilateral inguinal hernia repairs, with umbilical hernia repair.  His operation was Jan 14, 2023.  Reports a progressive bilateral groin pain, which appears to be exacerbated with lifting, bending, twisting, essentially activity.  Denies any remarkable pain with coughing or sneezing.  The pain seems to be throughout both groin areas, denies any new bulges in either groin.     Vital signs: BP (!) 128/90   Pulse 70   Temp 98 F (36.7 C)   Ht 5\' 11"  (1.803 m)   Wt 228 lb (103.4 kg)   SpO2 97%   BMI 31.80 kg/m    Physical Exam: Constitutional: Appears well Chest: CTA no evidence of distress, with normal respiratory effort.  Cor:  RRR well perfused.  Abdomen: Soft nontender.  The umbilicus is concave, nontender. Skin: Incisions well-healed, groin soft, subjectively tender over external rings, without evidence of bulge or mass.  Assessment/Plan: This is a 57 y.o. male 6 months s/p robotic bilateral inguinal hernia repairs and umbilical hernia repair.  Now with somewhat troublesome/concerning bilateral inguinal discomfort exacerbated by activity.  Question groin strain versus possible early recurrence.  Patient Active Problem List   Diagnosis Date Noted   Status post laparoscopic hernia repair 01/26/2022   Non-recurrent bilateral inguinal hernia without obstruction or gangrene 12/03/2021   Overweight 11/09/2020   Dyslipidemia 11/09/2020   Essential hypertension 11/09/2020    -Will obtain CT scan of pelvis, and have patient follow-up thereafter.  We discussed trial of some OTC pain medications, he does not like to take medications and feels that the discomfort is somewhat tolerable.  He notes that the tenderness appears to be exertional.   Campbell Lerner M.D., [facility] 07/28/2023, 3:20 PM

## 2023-07-28 ENCOUNTER — Ambulatory Visit: Payer: BC Managed Care – PPO | Admitting: Surgery

## 2023-07-28 ENCOUNTER — Encounter: Payer: Self-pay | Admitting: Surgery

## 2023-07-28 VITALS — BP 128/90 | HR 70 | Temp 98.0°F | Ht 71.0 in | Wt 228.0 lb

## 2023-07-28 DIAGNOSIS — R1032 Left lower quadrant pain: Secondary | ICD-10-CM | POA: Diagnosis not present

## 2023-07-28 DIAGNOSIS — K4021 Bilateral inguinal hernia, without obstruction or gangrene, recurrent: Secondary | ICD-10-CM

## 2023-07-28 DIAGNOSIS — R1031 Right lower quadrant pain: Secondary | ICD-10-CM

## 2023-07-28 NOTE — Patient Instructions (Addendum)
You are scheduled for a CT of the pelvis at [facility] in [place] on Saturday November 30th. You will need to arrive there by 1:30 pm and check in through the ED entrance.   We will see you back here to discuss your results and the next steps.

## 2023-08-06 ENCOUNTER — Ambulatory Visit (HOSPITAL_BASED_OUTPATIENT_CLINIC_OR_DEPARTMENT_OTHER)
Admission: RE | Admit: 2023-08-06 | Discharge: 2023-08-06 | Disposition: A | Discharge status: Home / Self Care | Payer: BC Managed Care – PPO | Source: Ambulatory Visit | Attending: Surgery | Admitting: Surgery

## 2023-08-06 DIAGNOSIS — K4021 Bilateral inguinal hernia, without obstruction or gangrene, recurrent: Secondary | ICD-10-CM | POA: Diagnosis present

## 2023-08-06 MED ORDER — IOHEXOL 300 MG/ML  SOLN
100.0000 mL | Freq: Once | INTRAMUSCULAR | Status: AC | PRN
  Administered 2023-08-06: 100 mL via INTRAVENOUS

## 2023-08-18 ENCOUNTER — Ambulatory Visit (INDEPENDENT_AMBULATORY_CARE_PROVIDER_SITE_OTHER): Payer: BC Managed Care – PPO | Admitting: Surgery

## 2023-08-18 DIAGNOSIS — R1032 Left lower quadrant pain: Secondary | ICD-10-CM | POA: Diagnosis not present

## 2023-08-18 DIAGNOSIS — Z6831 Body mass index (BMI) 31.0-31.9, adult: Secondary | ICD-10-CM | POA: Diagnosis not present

## 2023-08-18 DIAGNOSIS — R1031 Right lower quadrant pain: Secondary | ICD-10-CM

## 2023-08-18 DIAGNOSIS — E66811 Obesity, class 1: Secondary | ICD-10-CM | POA: Diagnosis not present

## 2023-08-18 NOTE — Progress Notes (Signed)
[facility] Tele- VISIT  08/18/2023  HPI: Edward [name] is a 57 y.o. male  s/p robotic bilateral inguinal hernia repairs, with umbilical hernia repair.  His operation was Jan 14, 2023.  Previously, he reported a progressive bilateral groin pain, which appears to be exacerbated with lifting, bending, twisting, essentially activity.  Denied any remarkable pain with coughing or sneezing.  The pain seems to be throughout both groin areas, and denied any new bulges in either groin.   Due to an inability to obtain a ride today, I reached out to him by telephone to review his current status and his recent CT scan report. He reports to me that he is getting along significantly better, getting around well and not having anywhere near the pain or tenderness he had on his last visit.  I did my best to explain to him the CT scan report and my personal review of the CT images, to reassure him that he is not in any imminent danger of any changes that might compromise his testicles or put him in danger of an incarcerated hernia.  I reassured him that the reported small hernias present are all fat filled and I suspect are due to retroperitoneal adipose, rather than actual hernia repair failure, and recurrence. I believe he can be followed symptomatically, I will be glad to see him should any change arise.  I would like to evaluate him on clinical examination every 3 to 6 months as long as he is having any kind of symptoms. I believe he understands this, understands our desire to follow-up with him, and will reach out to him to remind him about a follow-up visit in no later than 3 months.  CLINICAL DATA:  Recurrent inguinal hernia   EXAM: CT PELVIS WITH CONTRAST   TECHNIQUE: Multidetector CT imaging of the pelvis was performed using the standard protocol following the bolus administration of intravenous contrast.   RADIATION DOSE REDUCTION: This exam was performed according to  the departmental dose-optimization program which includes automated exposure control, adjustment of the mA and/or kV according to patient size and/or use of iterative reconstruction technique.   CONTRAST:  OMNIPAQUE IOHEXOL 300 MG/ML  SOLN   COMPARISON:  10/19/2021   FINDINGS: Urinary Tract:  Unremarkable   Bowel: Mild sigmoid colon diverticulosis without active diverticulitis. Normal appendix.   Vascular/Lymphatic: Mild common iliac artery atheromatous vascular calcification bilaterally. No pathologic adenopathy.   Reproductive:  Unremarkable   Other:  No supplemental non-categorized findings.   Musculoskeletal: Anterior hernia mesh noted. On the right side this mesh bulges down into a right groin hernia, the inferior epigastric vessels appear to bulge out along this hernia as well, making it difficult to differentiate between indirect inguinal and femoral hernia. There is no herniated bowel or bowel in the immediate vicinity. Herniated adipose tissue outside the confines of the mesh in this vicinity measures about 2.5 by 2.3 by 3.5 cm.   Similarly on the left side, the hernia mesh appears to bulge out into a small left groin hernia, mostly medial to the inferior epigastric vessels which tends to favor a femoral hernia. There is some herniated adipose tissue proximally along the spermatic cord, in a manner similar to the prior exam. On the left side, very little of the adipose tissue is outside the margins of the mesh.   Partially fused right sacroiliac joint.   IMPRESSION: 1. Bilateral small groin hernias containing adipose tissue, generally favoring femoral hernias given the location of the inferior  epigastric vessels although more ambiguous on the right side, with hernia mesh bulging down into the hernia defects. The right side there is adipose tissue presumably skirting around the margin of the hernia mesh and extending beyond the hernia mesh. 2. Mild sigmoid  colon diverticulosis without active diverticulitis. 3. Mild common iliac artery atheromatous vascular calcification bilaterally. 4. Partially fused right sacroiliac joint.     Electronically Signed   By: Gaylyn Rong M.D.   On: 08/17/2023 14:50  Assessment/Plan: This is a 57 y.o. male 6 months s/p robotic bilateral inguinal hernia repairs and umbilical hernia repair.  Now with resolving/improving bilateral inguinal discomfort previously exacerbated by activity, and now reports much improved mobility.  Question resolving groin strain more likely than any possible early recurrence, as the CT scan/radiology report might indicate. Upon review of his CT scan, and thorough discussion, we will continue to follow his groin discomfort clinically.  Total time spent 23 minutes.   Patient Active Problem List   Diagnosis Date Noted   Bilateral groin pain 07/28/2023   Status post laparoscopic hernia repair 01/26/2022   Non-recurrent bilateral inguinal hernia without obstruction or gangrene 12/03/2021   Overweight 11/09/2020   Dyslipidemia 11/09/2020   Essential hypertension 11/09/2020    -Will reach out to him for 86-month follow-up for clinical examination and will be glad to accommodate him as needed in the interval should there be any increasing pain or discomfort.  Campbell Lerner M.D., FACS 08/18/2023, 1:29 PM

## 2023-09-21 ENCOUNTER — Other Ambulatory Visit: Payer: Self-pay | Admitting: Cardiology

## 2023-09-21 DIAGNOSIS — E785 Hyperlipidemia, unspecified: Secondary | ICD-10-CM

## 2023-10-17 ENCOUNTER — Other Ambulatory Visit: Payer: Self-pay | Admitting: Cardiology

## 2023-10-17 DIAGNOSIS — E785 Hyperlipidemia, unspecified: Secondary | ICD-10-CM

## 2023-10-25 ENCOUNTER — Other Ambulatory Visit: Payer: Self-pay | Admitting: Cardiology

## 2023-10-25 DIAGNOSIS — E785 Hyperlipidemia, unspecified: Secondary | ICD-10-CM

## 2023-11-15 NOTE — Progress Notes (Signed)
 [facility] Tele- VISIT  11/15/2023  HPI: Edward [name] is a 58 y.o. male  s/p robotic bilateral inguinal hernia repairs, with primary sutured umbilical hernia repair.  His operation was Jan 13, 2022.  Extra large Bard 3D light mesh was utilized.  Previously, he reported progressive bilateral groin pain, which continues to be exacerbated with lifting, bending, twisting, essentially any activity.  The pain seems to be throughout both groin areas, and denied any new bulges in either groin.     CLINICAL DATA:  Recurrent inguinal hernia   EXAM: CT PELVIS WITH CONTRAST Scan obtained on August 06, 2023 TECHNIQUE: Multidetector CT imaging of the pelvis was performed using the standard protocol following the bolus administration of intravenous contrast.   RADIATION DOSE REDUCTION: This exam was performed according to the departmental dose-optimization program which includes automated exposure control, adjustment of the mA and/or kV according to patient size and/or use of iterative reconstruction technique.   CONTRAST:  OMNIPAQUE IOHEXOL 300 MG/ML  SOLN   COMPARISON:  10/19/2021   FINDINGS: Urinary Tract:  Unremarkable   Bowel: Mild sigmoid colon diverticulosis without active diverticulitis. Normal appendix.   Vascular/Lymphatic: Mild common iliac artery atheromatous vascular calcification bilaterally. No pathologic adenopathy.   Reproductive:  Unremarkable Other:  No supplemental non-categorized findings.   Musculoskeletal: Anterior hernia mesh noted. On the right side this mesh bulges down into a right groin hernia, the inferior epigastric vessels appear to bulge out along this hernia as well, making it difficult to differentiate between indirect inguinal and femoral hernia. There is no herniated bowel or bowel in the immediate vicinity. Herniated adipose tissue outside the confines of the mesh in this vicinity measures about 2.5 by 2.3 by 3.5 cm.    Similarly on the left side, the hernia mesh appears to bulge out into a small left groin hernia, mostly medial to the inferior epigastric vessels which tends to favor a femoral hernia. There is some herniated adipose tissue proximally along the spermatic cord, in a manner similar to the prior exam. On the left side, very little of the adipose tissue is outside the margins of the mesh.   Partially fused right sacroiliac joint.   IMPRESSION: 1. Bilateral small groin hernias containing adipose tissue, generally favoring femoral hernias given the location of the inferior epigastric vessels although more ambiguous on the right side, with hernia mesh bulging down into the hernia defects. The right side there is adipose tissue presumably skirting around the margin of the hernia mesh and extending beyond the hernia mesh. 2. Mild sigmoid colon diverticulosis without active diverticulitis. 3. Mild common iliac artery atheromatous vascular calcification bilaterally. 4. Partially fused right sacroiliac joint.   Electronically Signed   By: Gaylyn Rong M.D.   On: 08/17/2023 14:50 for scan done on November 30  My attempt to examine him patient deferred, requesting not to repeat exam as it is so painful.  His wife encouraged him to allow me to examine him, however he remained reluctant to do so, and unfortunately I was not sufficiently convincing to persuade him.  Assessment/Plan: This is a 58 y.o. male  months s/p robotic bilateral inguinal hernia repairs and umbilical hernia repair.  Now with  bilateral inguinal pain exacerbated by activity, question nerve/mesh pain versus actual recurrence.     Patient Active Problem List   Diagnosis Date Noted   Class 1 obesity with body mass index (BMI) of 31.0 to 31.9 in adult 08/18/2023   Bilateral groin pain 07/28/2023  Status post laparoscopic hernia repair 01/26/2022   Non-recurrent bilateral inguinal hernia without obstruction or gangrene  12/03/2021   Overweight 11/09/2020   Dyslipidemia 11/09/2020   Essential hypertension 11/09/2020    -I would like to make referral to hernia specialist who may have a stronger opinion to intervene based on current imaging, to consider additional imaging, or to consider evaluation/assistance from pain specialist.  This was discussed thoroughly with the patient and his wife, who appreciate the opportunity to escalate resolution of this ongoing pain issue that only began after his hernia surgery.  Campbell Lerner M.D., FACS 11/15/2023, 3:26 PM

## 2023-11-17 ENCOUNTER — Ambulatory Visit: Payer: BC Managed Care – PPO | Admitting: Surgery

## 2023-11-24 ENCOUNTER — Ambulatory Visit (INDEPENDENT_AMBULATORY_CARE_PROVIDER_SITE_OTHER): Admitting: Surgery

## 2023-11-24 ENCOUNTER — Encounter: Payer: Self-pay | Admitting: Surgery

## 2023-11-24 VITALS — BP 162/110 | HR 73 | Temp 98.0°F | Ht 71.0 in | Wt 233.6 lb

## 2023-11-24 DIAGNOSIS — R1031 Right lower quadrant pain: Secondary | ICD-10-CM | POA: Diagnosis not present

## 2023-11-24 DIAGNOSIS — R1032 Left lower quadrant pain: Secondary | ICD-10-CM | POA: Diagnosis not present

## 2023-11-30 ENCOUNTER — Other Ambulatory Visit: Payer: Self-pay | Admitting: Cardiology

## 2023-11-30 DIAGNOSIS — E785 Hyperlipidemia, unspecified: Secondary | ICD-10-CM

## 2023-12-08 ENCOUNTER — Ambulatory Visit: Admitting: Cardiology

## 2023-12-08 ENCOUNTER — Ambulatory Visit: Payer: BC Managed Care – PPO | Admitting: Cardiology

## 2023-12-23 ENCOUNTER — Other Ambulatory Visit: Payer: Self-pay | Admitting: Cardiology

## 2023-12-23 DIAGNOSIS — E785 Hyperlipidemia, unspecified: Secondary | ICD-10-CM

## 2024-01-12 ENCOUNTER — Other Ambulatory Visit: Payer: Self-pay | Admitting: Cardiology

## 2024-01-12 NOTE — Telephone Encounter (Signed)
 Although pt is to f/u prn, last visit 2023. Needs office visit.

## 2024-02-08 NOTE — Progress Notes (Addendum)
 Cardiology Office Note:   Date:  02/09/2024  ID:  Edward [name], DOB Dec 01, 1965, MRN [id number] PCP: Donnise Norleen Lenis, MD  [facility] Providers Cardiologist:  None {  History of Present Illness:   Edward [name] is a 58 y.o. male who was referred for evaluation of HTN by Hungarland, Norleen Lenis, MD.  The patient has also had a history of chest discomfort.  He was followed by a cardiologist in [place] but switched care.  He reports having a stress test about 10 years ago that was apparently negative for any evidence of ischemia.  I was able to review some notes from her previous cardiology appointment in [place] .  He had normal left ventricular ejection fraction.  There were no significant abnormalities on this echo.  This was from September 2021.   He did have an elevated coronary calcium  score.  This was 73 which was 85 percentile.  However, he had a negative follow-up POET (Plain Old Exercise Treadmill)  He did have a sleep study and was found to have sleep apnea and is now wearing CPAP. He feels better with this.   Since I saw him he has had no new cardiovascular problems.  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  He is probably can have to have surgery on these.  This has limited his physical activity.  The patient denies any new symptoms such as chest discomfort, neck or arm discomfort. There has been no new shortness of breath, PND or orthopnea. There have been no reported palpitations, presyncope or syncope.  He has some chronic baseline dyspnea with exertion   ROS: As stated in the HPI and negative for all other systems.  Studies Reviewed:    EKG:   EKG Interpretation Date/Time:  Thursday February 09 2024 16:21:38 EDT Ventricular Rate:  71 PR Interval:  186 QRS Duration:  86 QT Interval:  386 QTC Calculation: 419 R Axis:   13  Text Interpretation: Normal sinus rhythm Normal ECG Confirmed by Lavona Agent ([id number]) on 02/09/2024 4:52:53 PM      Risk Assessment/Calculations:       Physical Exam:   VS:  BP (!) 130/98 (BP Location: Left Arm, Patient Position: Sitting, Cuff Size: Normal)   Pulse 77   Ht 5' 11 (1.803 m)   Wt 231 lb 6.4 oz (105 kg)   SpO2 96%   BMI 32.27 kg/m    Wt Readings from Last 3 Encounters:  02/09/24 231 lb 6.4 oz (105 kg)  11/24/23 233 lb 9.6 oz (106 kg)  07/28/23 228 lb (103.4 kg)     GEN: Well nourished, well developed in no acute distress NECK: No JVD; No carotid bruits CARDIAC: RRR, no murmurs, rubs, gallops RESPIRATORY:  Clear to auscultation without rales, wheezing or rhonchi  ABDOMEN: Soft, non-tender, non-distended EXTREMITIES:  No edema; No deformity   ASSESSMENT AND PLAN:   HTN:   Blood pressure is elevated but he does not think it is elevated at home and he is gena keep a blood pressure diary.  No change in therapy.  He will send those readings to me on MyChart.  DYSLIPIDEMIA:   LDL was 57 with an HDL of 61 and a high-dose statin and I would continue this.  ELEVATED CORONARY CALCIUM :   This was 85 and 73rd percentile.  He had a negative POET (Plain Old Exercise Treadmill) .  He has no new symptoms since the POET.  No change in therapy.  We talked about  continued risk reduction.   SLEEP APNEA: He uses CPAP.  He is using and benefiting from CPAP therapy.  We have reviewed his compliance report.  He has excellent compliance and excellent results.  PREOP: The patient is acceptable risk if he has to have inguinal hernia repair.  No further cardiovascular testing would be suggested that he has had in the next 6 months and he has no change in cardiovascular symptoms.         Follow up with me as needed  Signed, Lynwood Schilling, MD

## 2024-02-09 ENCOUNTER — Encounter: Payer: Self-pay | Admitting: Cardiology

## 2024-02-09 ENCOUNTER — Ambulatory Visit: Attending: Cardiology | Admitting: Cardiology

## 2024-02-09 VITALS — BP 130/98 | HR 77 | Ht 71.0 in | Wt 231.4 lb

## 2024-02-09 DIAGNOSIS — E785 Hyperlipidemia, unspecified: Secondary | ICD-10-CM | POA: Diagnosis not present

## 2024-02-09 DIAGNOSIS — I2510 Atherosclerotic heart disease of native coronary artery without angina pectoris: Secondary | ICD-10-CM | POA: Diagnosis not present

## 2024-02-09 NOTE — Patient Instructions (Signed)
 Medication Instructions:  Your physician recommends that you continue on your current medications as directed. Please refer to the Current Medication list given to you today.  *If you need a refill on your cardiac medications before your next appointment, please call your pharmacy*  Lab Work: NONE If you have labs (blood work) drawn today and your tests are completely normal, you will receive your results only by: MyChart Message (if you have MyChart) OR A paper copy in the mail If you have any lab test that is abnormal or we need to change your treatment, we will call you to review the results.  Testing/Procedures: NONE  Follow-Up: At [facility], you and your health needs are our priority.  As part of our continuing mission to provide you with exceptional heart care, our providers are all part of one team.  This team includes your primary Cardiologist (physician) and Advanced Practice Providers or APPs (Physician Assistants and Nurse Practitioners) who all work together to provide you with the care you need, when you need it.  Your next appointment:   3 year(s)  Provider:   Lavonne Prairie, MD  We recommend signing up for the patient portal called "MyChart".  Sign up information is provided on this After Visit Summary.  MyChart is used to connect with patients for Virtual Visits (Telemedicine).  Patients are able to view lab/test results, encounter notes, upcoming appointments, etc.  Non-urgent messages can be sent to your provider as well.   To learn more about what you can do with MyChart, go to [email or web address].   Other Instructions: Blood pressure diary: Take blood pressure 2-3 times daily for 10 days and send us  the results on MyChart.

## 2024-02-21 ENCOUNTER — Encounter: Payer: Self-pay | Admitting: Cardiology

## 2024-02-28 ENCOUNTER — Telehealth: Payer: Self-pay

## 2024-02-28 MED ORDER — AMLODIPINE BESYLATE 2.5 MG PO TABS: 2.5000 mg | ORAL_TABLET | Freq: Every morning | ORAL | 3 refills | Status: AC

## 2024-02-28 NOTE — Telephone Encounter (Signed)
 Spoke with pt regarding the blood pressure readings he dropped off and Dr. Denver suggestions. Pt is aware of new prescription, Amlodipine  2.5 mg once daily in the morning. Prescriptions sent to pt's pharmacy of choice. Pt verbalized understanding. All questions if any were answered. Blood pressure readings uploaded to chart.

## 2024-03-06 ENCOUNTER — Telehealth: Payer: Self-pay | Admitting: Cardiology

## 2024-03-06 NOTE — Telephone Encounter (Signed)
 Patient stated his CPAP face mask tore and he will need to get a new one.  Patient stated he uses [facility], [place]  (ph# [phone], fax# [phone]) and will need a new one as soon as possible.

## 2024-03-06 NOTE — Telephone Encounter (Signed)
 Rx for new mask of choice and office note sent to [facility], [place].

## 2024-04-03 ENCOUNTER — Other Ambulatory Visit: Payer: Self-pay | Admitting: Cardiology

## 2024-04-03 DIAGNOSIS — E785 Hyperlipidemia, unspecified: Secondary | ICD-10-CM

## 2024-04-06 ENCOUNTER — Telehealth: Payer: Self-pay

## 2024-04-06 NOTE — Telephone Encounter (Signed)
 Patient is returning a call back to [place]. Patient is requesting a call back. Please advise.

## 2024-04-06 NOTE — Telephone Encounter (Signed)
 Left message for patient to return call to our office to schedule follow up appointment with Dr Donia B app.  Will continue efforts.

## 2024-04-06 NOTE — Telephone Encounter (Signed)
-----   Message from [facility] sent at 07/04/2023  3:03 PM EDT ----- Regarding: October 2025 1 year follow up, GERD, barrett's, VC

## 2024-04-06 NOTE — Telephone Encounter (Signed)
 Patient advised that he is scheduled on 06-11-24 at 3pm with Deanna May, NP.  Patient agreed to plan and verbalized understanding.  No further questions.

## 2024-04-07 ENCOUNTER — Other Ambulatory Visit: Payer: Self-pay | Admitting: Cardiology

## 2024-06-07 ENCOUNTER — Telehealth: Payer: Self-pay | Admitting: Gastroenterology

## 2024-06-07 NOTE — Telephone Encounter (Signed)
 Good afternoon Edward [name],  Patient is scheduled for a follow up visit with you for 10/6. Patient states he lives an hour away and would like to know if he can have visit virtually due to only discussing for a repeat endoscopy is needed. Please advise further.   Thank you

## 2024-06-11 ENCOUNTER — Encounter: Payer: Self-pay | Admitting: Gastroenterology

## 2024-06-11 ENCOUNTER — Telehealth (INDEPENDENT_AMBULATORY_CARE_PROVIDER_SITE_OTHER): Admitting: Gastroenterology

## 2024-06-11 DIAGNOSIS — K2270 Barrett's esophagus without dysplasia: Secondary | ICD-10-CM

## 2024-06-11 DIAGNOSIS — Z860100 Personal history of colon polyps, unspecified: Secondary | ICD-10-CM

## 2024-06-11 DIAGNOSIS — K219 Gastro-esophageal reflux disease without esophagitis: Secondary | ICD-10-CM | POA: Diagnosis not present

## 2024-06-11 MED ORDER — OMEPRAZOLE 40 MG PO CPDR: 40.0000 mg | DELAYED_RELEASE_CAPSULE | Freq: Every day | ORAL | 11 refills | Status: AC

## 2024-06-11 NOTE — Patient Instructions (Signed)
 You have been scheduled for an endoscopy. Please follow written instructions given to you at your visit today.  If you use inhalers (even only as needed), please bring them with you on the day of your procedure.  If you take any of the following medications, they will need to be adjusted prior to your procedure:   DO NOT TAKE 7 DAYS PRIOR TO TEST- Trulicity (dulaglutide) Ozempic, Wegovy (semaglutide) Mounjaro (tirzepatide) Bydureon Bcise (exanatide extended release)  DO NOT TAKE 1 DAY PRIOR TO YOUR TEST Rybelsus (semaglutide) Adlyxin (lixisenatide) Victoza (liraglutide) Byetta (exanatide) ___________________________________________________________________________  Due to recent changes in healthcare laws, you may see the results of your imaging and laboratory studies on MyChart before your provider has had a chance to review them.  We understand that in some cases there may be results that are confusing or concerning to you. Not all laboratory results come back in the same time frame and the provider may be waiting for multiple results in order to interpret others.  Please give us  48 hours in order for your provider to thoroughly review all the results before contacting the office for clarification of your results.   _______________________________________________________  If your blood pressure at your visit was 140/90 or greater, please contact your primary care physician to follow up on this.  _______________________________________________________  If you are age 48 or older, your body mass index should be between 23-30. Your There is no height or weight on file to calculate BMI. If this is out of the aforementioned range listed, please consider follow up with your Primary Care Provider.  If you are age 5 or younger, your body mass index should be between 19-25. Your There is no height or weight on file to calculate BMI. If this is out of the aformentioned range listed, please  consider follow up with your Primary Care Provider.   ________________________________________________________  The [facility] providers would like to encourage you to use MYCHART to communicate with providers for non-urgent requests or questions.  Due to long hold times on the telephone, sending your provider a message by [facility] may be a faster and more efficient way to get a response.  Please allow 48 business hours for a response.  Please remember that this is for non-urgent requests.  _______________________________________________________  [facility] is using a team-based approach to care.  Your team is made up of your doctor and two to three APPS. Our APPS (Nurse Practitioners and Physician Assistants) work with your physician to ensure care continuity for you. They are fully qualified to address your health concerns and develop a treatment plan. They communicate directly with your gastroenterologist to care for you. Seeing the Advanced Practice Practitioners on your physician's team can help you by facilitating care more promptly, often allowing for earlier appointments, access to diagnostic testing, procedures, and other specialty referrals.   Thank you for trusting me with your gastrointestinal care. Deanna May, FNP-C

## 2024-06-11 NOTE — Progress Notes (Signed)
 Chief Complaint: yearly follow-up, GERD, Barrett's esophagus Primary GI Doctor: Dr. 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  This service was provided via telemedicine with audio/visual communication. The patient was located at home The provider was located in provider's GI office. The patient did consent to this telephone visit and is aware of possible charges through their insurance for this visit.   The persons participating in this telemedicine service were the patient and I. Time spent on call:    Interval History    Patient has history of  GERD complicated with Barrett's esophagus  He is currently taking omeprazole  40 mg po daily and reports his GERD is controlled.  Patient does have intermittent symptoms of globus sensation. He notes history of seasonal allergies and post nasal drip.  No other complaints at this time  PREVIOUS GI WORKUP    EGD 08/2021 for follow-up of Barrett's - Esophageal mucosal changes suggestive of short- segment Barrett' s esophagus. Biopsied.  (Intestinal metaplasia consistent with Barrett's) - 5 cm hiatal hernia.  - Erythematous mucosa in the gastric body and antrum. Biopsied.  (Mild inflammation consistent with reflux, negative for H. pylori) - Normal examined duodenum. - Repeat 3 years (08/2024)   Colonoscopy 08/2021 for screening - The examined portion of the ileum was normal.  - One 5 mm polyp (benign colonic mucosa) in the transverse colon, removed with a cold snare. Resected and retrieved.  - Moderate diverticulosis in the sigmoid colon, in the descending colon, in the transverse colon and in the ascending colon.  - Small internal hemorrhoids. - Repeat 10 years (08/2031)   EGD performed on 08/25/2018, [facility], Dr. Garnette Hamman --Mucosal changes consistent with  short segment Barrett's esophagus involving the mucosa 34 to 35 cm from the incisors. Scattered islands of salmon-colored mucosa at 33 cm. Maximal extent 3 cm. Biopsied. Medium size hiatal hernia. Diffuse erythematous mucosa in the gastric antrum which was biopsied. Normal examined duodenum. Pathology: Chronic inflammation of cardia type gastric mucosa negative for Barrett's esophagus.  Squamous mucosa with changes consistent with reflux esophagitis.  Negative for dysplasia.  Gastric biopsies moderate chronic active gastritis positive for H. Pylori. --He was then treated with amoxicillin, Biaxin and Prilosec. --H. pylori stool antigen 11/09/2018 not detected; confirmation of eradication   EGD performed 10/07/2014, Dr. Hamman --Mucosal changes consistent with short segment Barrett's esophagus Prague criteria C1-M3.  Biopsied.  Small hiatus hernia.  Patchy erythematous mucosa antrum.  Normal duodenum.  Pathology: Focal intestinal metaplasia consistent with Barrett's esophagus.  No dysplasia.  This was the finding at both 34 and 35 cm.  Wt Readings from Last 3 Encounters:  02/09/24 231 lb 6.4 oz (105 kg)  11/24/23 233 lb 9.6 oz (106 kg)  07/28/23 228 lb (103.4 kg)      Past Medical History:  Diagnosis Date   Anxiety    Barrett's esophagus    Charcot-Marie-Tooth disease    Claustrophobia    Elevated liver enzymes    GERD (gastroesophageal reflux disease)    Hiatal hernia    History of stress test    Hyperlipidemia    Hypertension    Non-recurrent bilateral inguinal hernia without obstruction or gangrene 12/03/2021   OSA on CPAP    Pre-diabetes     Past Surgical History:  Procedure Laterality Date   COLONOSCOPY     ESOPHAGOGASTRODUODENOSCOPY ENDOSCOPY     INSERTION  OF MESH  01/13/2022   Procedure: INSERTION OF MESH;  Surgeon: Lane Shope, MD;  Location: [facility] ORS;  Service: General;;   VASECTOMY     WISDOM TOOTH EXTRACTION      Current Outpatient Medications  Medication Sig  Dispense Refill   ALPRAZolam  (XANAX ) 0.5 MG tablet Take 1 tablet (0.5 mg total) by mouth 3 (three) times daily as needed. 90 tablet 2   amLODipine  (NORVASC ) 2.5 MG tablet Take 1 tablet (2.5 mg total) by mouth in the morning. 90 tablet 3   Ascorbic Acid (VITAMIN C PO) Take 1 tablet by mouth daily.     atorvastatin  (LIPITOR) 80 MG tablet Take 1 tablet (80 mg total) by mouth daily. 90 tablet 3   Cholecalciferol (VITAMIN D3) 125 MCG (5000 UT) TABS Take 1 tablet by mouth daily.     gabapentin  (NEURONTIN ) 100 MG capsule Take 100 mg by mouth 3 (three) times daily.     MEGARED OMEGA-3 KRILL OIL PO Take 1 capsule by mouth daily.     omeprazole  (PRILOSEC) 40 MG capsule Take 1 capsule (40 mg total) by mouth daily. 30 capsule 11   valsartan -hydrochlorothiazide  (DIOVAN -HCT) 160-12.5 MG tablet TAKE 1 TABLET BY MOUTH EVERY DAY 90 tablet 3   zinc gluconate 50 MG tablet Take 150 mg by mouth daily.     No current facility-administered medications for this visit.    Allergies as of 06/11/2024   (No Known Allergies)    Family History  Problem Relation Age of Onset   Depression Mother    Anxiety disorder Mother    Colon polyps Mother    Diabetes Mother    Multiple sclerosis Mother    Depression Father    Anxiety disorder Father    Heart disease Father        Pacemaker   Heart attack Father    Stroke Father    Anxiety disorder Sister    Heart attack Paternal Grandfather    Anxiety disorder Paternal Grandmother    Cancer Paternal Grandmother        ?   Heart disease Other    Colon cancer Neg Hx    Esophageal cancer Neg Hx    Stomach cancer Neg Hx    Rectal cancer Neg Hx     Review of Systems:    Constitutional: No weight loss, fever, chills, weakness or fatigue HEENT: Eyes: No change in vision               Ears, Nose, Throat:  No change in hearing or congestion Skin: No rash or itching Cardiovascular: No chest pain, chest pressure or palpitations   Respiratory: No SOB or  cough Gastrointestinal: See HPI and otherwise negative Genitourinary: No dysuria or change in urinary frequency Neurological: No headache, dizziness or syncope Musculoskeletal: No new muscle or joint pain Hematologic: No bleeding or bruising Psychiatric: No history of depression or anxiety    Physical Exam:  Vital signs: There were no vitals taken for this visit.  General Appearance:Well sounding, in no apparent distress.  ENT/Mouth: No hoarseness, No cough for duration of visit.  Respiratory: completing full sentences without distress, without audible wheeze Neuro: Awake and oriented X 3,  Psych:  Insight and Judgment appropriate.    RELEVANT LABS AND IMAGING: CBC    Latest Ref Rng & Units 10/19/2021    8:36 PM 11/10/2020   11:32 AM  CBC  WBC 4.0 - 10.5 K/uL 8.1  6.9   Hemoglobin 13.0 - 17.0 g/dL 16.2  17.4   Hematocrit 39.0 - 52.0 % 48.1  51.0   Platelets 150 - 400 K/uL 205  208      CMP     Latest Ref Rng & Units 10/19/2021    8:36 PM 11/10/2020   11:32 AM  CMP  Glucose 70 - 99 mg/dL 96    BUN 6 - 20 mg/dL 13    Creatinine 9.38 - 1.24 mg/dL 9.21    Sodium 864 - 854 mmol/L 138    Potassium 3.5 - 5.1 mmol/L 3.9    Chloride 98 - 111 mmol/L 102    CO2 22 - 32 mmol/L 21    Calcium  8.9 - 10.3 mg/dL 9.9    Total Protein 6.5 - 8.1 g/dL 7.9  7.3   Total Bilirubin 0.3 - 1.2 mg/dL 0.5  0.6   Alkaline Phos 38 - 126 U/L 75  88   AST 15 - 41 U/L 71  32   ALT 0 - 44 U/L 76  46    05/2020 echo- LVEF 60-65%  Assessment/Plan: Barrett's esophagus without dysplasia Gastroesophageal reflux disease, unspecified whether esophagitis present Well-controlled on PPI daily - Refill omeprazole  40 Mg daily for 1 year - Schedule surveillance EGD in [facility] with Dr. Albertus for 08/2024. The risks and benefits of EGD with possible biopsies and esophageal dilation were discussed with the patient who agrees to proceed.  CRC screening Due 2032  I verified that I am speaking with the correct person  using two identifiers.  I discussed the limitations, risks, security and privacy concerns of performing an evaluation and management service by telephone and the availability of in person appointments. I also discussed with the patient that there Edward [name] be a charge for the services provided today billed to their insurance including co-pays and deductible . The patient expressed understanding and agreed to proceed.    Thank you for the courtesy of this consult. Please call me with any questions or concerns.   Gyasi Hazzard, FNP-C [facility] 06/11/2024, 8:49 AM  Cc: Hungarland, Norleen Lenis,*

## 2024-08-06 ENCOUNTER — Telehealth: Payer: Self-pay | Admitting: Gastroenterology

## 2024-08-06 NOTE — Telephone Encounter (Signed)
 Patient states he has not received prep instructions for upcoming endoscopy. Please advise, thank you

## 2024-08-07 NOTE — Telephone Encounter (Signed)
 Patient calling in regards to previous note. Needing additional prep instruction. Pleaser advise.

## 2024-08-07 NOTE — Telephone Encounter (Signed)
 Returned pt's call. Reviewed instructions over the phone. Also sent instructions to pt via MyChart. All questions answered at the end of the call.

## 2024-08-08 ENCOUNTER — Ambulatory Visit: Admitting: Internal Medicine

## 2024-08-08 ENCOUNTER — Encounter: Payer: Self-pay | Admitting: Internal Medicine

## 2024-08-08 VITALS — BP 126/78 | HR 67 | Temp 97.9°F | Resp 16 | Ht 69.0 in | Wt 230.0 lb

## 2024-08-08 DIAGNOSIS — K2270 Barrett's esophagus without dysplasia: Secondary | ICD-10-CM

## 2024-08-08 DIAGNOSIS — Z13810 Encounter for screening for upper gastrointestinal disorder: Secondary | ICD-10-CM | POA: Diagnosis not present

## 2024-08-08 MED ORDER — SODIUM CHLORIDE 0.9 % IV SOLN: 500.0000 mL | INTRAVENOUS | Status: DC

## 2024-08-08 NOTE — Progress Notes (Signed)
 Called to room to assist during endoscopic procedure.  Patient ID and intended procedure confirmed with present staff. Received instructions for my participation in the procedure from the performing physician.

## 2024-08-08 NOTE — Op Note (Signed)
 [facility] Patient Name: Edward [name] Procedure Date: 08/08/2024 1:25 PM MRN: [id number] Endoscopist: Gordy CHRISTELLA Starch , MD, [id number] Age: 58 Referring MD:  Date of Birth: [birth year] Gender: Male Account #: [id number] Procedure:                Upper GI endoscopy Indications:              Surveillance for malignancy due to personal history                            of Barrett's esophagus (dx 2022 on omeprazole  and                            currently asymptomatic) Medicines:                Monitored Anesthesia Care Procedure:                Pre-Anesthesia Assessment:                           - Prior to the procedure, a History and Physical                            was performed, and patient medications and                            allergies were reviewed. The patient's tolerance of                            previous anesthesia was also reviewed. The risks                            and benefits of the procedure and the sedation                            options and risks were discussed with the patient.                            All questions were answered, and informed consent                            was obtained. Prior Anticoagulants: The patient has                            taken no anticoagulant or antiplatelet agents. ASA                            Grade Assessment: II - A patient with mild systemic                            disease. After reviewing the risks and benefits,                            the patient was deemed in satisfactory condition to  undergo the procedure.                           After obtaining informed consent, the endoscope was                            passed under direct vision. Throughout the                            procedure, the patient's blood pressure, pulse, and                            oxygen saturations were monitored continuously. The                            GIF [id number] #[id number] was  introduced through the                            mouth, and advanced to the second part of duodenum.                            The upper GI endoscopy was accomplished without                            difficulty. The patient tolerated the procedure                            well. Scope In: Scope Out: Findings:                 The esophagus and gastroesophageal junction were                            examined with white light and narrow band imaging                            (NBI) from a forward view and retroflexed position.                            There were esophageal mucosal changes secondary to                            established short-segment Barrett's disease. These                            changes involved the mucosa at the upper extent of                            the gastric folds (34 cm from the incisors)                            extending to the Z-line (32 cm from the incisors).                            Two  tongues of salmon-colored mucosa were present                            from 32 to 34 cm. The maximum longitudinal extent                            of these esophageal mucosal changes was 2 cm in                            length. Mucosa was biopsied with a cold forceps for                            histology in a targeted manner at intervals of 1 cm                            from 32 to 34 cm from the incisors. One specimen                            bottle was sent to pathology.                           A 5 cm hiatal hernia was present.                           The gastroesophageal flap valve was visualized                            endoscopically and classified as Hill Grade IV (no                            fold, wide open lumen, hiatal hernia present).                           The entire examined stomach was normal.                           The examined duodenum was normal. Complications:            No immediate complications. Estimated Blood  Loss:     Estimated blood loss was minimal. Impression:               - Esophageal mucosal changes secondary to                            established short-segment Barrett's disease.                            Biopsied.                           - 5 cm hiatal hernia.                           - Gastroesophageal flap valve classified as Hill  Grade IV (no fold, wide open lumen, hiatal hernia                            present).                           - Normal stomach.                           - Normal examined duodenum. Recommendation:           - Patient has a contact number available for                            emergencies. The signs and symptoms of potential                            delayed complications were discussed with the                            patient. Return to normal activities tomorrow.                            Written discharge instructions were provided to the                            patient.                           - Resume previous diet.                           - Continue present medications.                           - Await pathology results.                           - Repeat upper endoscopy after studies are complete                            for surveillance of Barrett's esophagus. Gordy CHRISTELLA Starch, MD 08/08/2024 2:00:18 PM This report has been signed electronically.

## 2024-08-08 NOTE — Progress Notes (Signed)
 GASTROENTEROLOGY PROCEDURE H&P NOTE   Primary Care Physician: Donnise Norleen Lenis, MD    Reason for Procedure:  History of Barrett's esophagus  Plan:    EGD  Patient is appropriate for endoscopic procedure(s) in the ambulatory ([facility]) setting.  The nature of the procedure, as well as the risks, benefits, and alternatives were carefully and thoroughly reviewed with the patient. Ample time for discussion and questions allowed.  All questions were answered. The patient understood, was satisfied, and agreed with the plan to proceed.    HPI: Edward [name] is a 58 y.o. male who presents for EGD.  Medical history as below.  No recent chest pain or shortness of breath.  No abdominal pain today.  Past Medical History:  Diagnosis Date   Anxiety    Barrett's esophagus    Charcot-Marie-Tooth disease    Claustrophobia    Elevated liver enzymes    GERD (gastroesophageal reflux disease)    Hiatal hernia    History of stress test    Hyperlipidemia    Hypertension    Non-recurrent bilateral inguinal hernia without obstruction or gangrene 12/03/2021   OSA on CPAP    Pre-diabetes     Past Surgical History:  Procedure Laterality Date   COLONOSCOPY     ESOPHAGOGASTRODUODENOSCOPY ENDOSCOPY     INSERTION OF MESH  01/13/2022   Procedure: INSERTION OF MESH;  Surgeon: Lane Shope, MD;  Location: [facility] ORS;  Service: General;;   VASECTOMY     WISDOM TOOTH EXTRACTION      Prior to Admission medications   Medication Sig Start Date End Date Taking? Authorizing Provider  ALPRAZolam  (XANAX ) 0.5 MG tablet Take 1 tablet (0.5 mg total) by mouth 3 (three) times daily as needed. 03/01/22  Yes Teresa Redell LABOR, NP  amLODipine  (NORVASC ) 2.5 MG tablet Take 1 tablet (2.5 mg total) by mouth in the morning. 02/28/24  Yes Hochrein, Lynwood, MD  Ascorbic Acid (VITAMIN C PO) Take 1 tablet by mouth daily.   Yes [provider]  atorvastatin  (LIPITOR) 80 MG tablet Take 1 tablet (80 mg total) by  mouth daily. 04/03/24  Yes Lavona Lynwood, MD  Cholecalciferol (VITAMIN D3) 125 MCG (5000 UT) TABS Take 1 tablet by mouth daily.   Yes [provider]  MEGARED OMEGA-3 KRILL OIL PO Take 1 capsule by mouth daily.   Yes [provider]  omeprazole  (PRILOSEC) 40 MG capsule Take 1 capsule (40 mg total) by mouth daily. 06/11/24  Yes May, Deanna J, NP  valsartan -hydrochlorothiazide  (DIOVAN -HCT) 160-12.5 MG tablet TAKE 1 TABLET BY MOUTH EVERY DAY 04/09/24  Yes Lavona Lynwood, MD  zinc gluconate 50 MG tablet Take 150 mg by mouth daily.   Yes [provider]  gabapentin  (NEURONTIN ) 100 MG capsule Take 100 mg by mouth 3 (three) times daily.    [provider]    Current Outpatient Medications  Medication Sig Dispense Refill   ALPRAZolam  (XANAX ) 0.5 MG tablet Take 1 tablet (0.5 mg total) by mouth 3 (three) times daily as needed. 90 tablet 2   amLODipine  (NORVASC ) 2.5 MG tablet Take 1 tablet (2.5 mg total) by mouth in the morning. 90 tablet 3   Ascorbic Acid (VITAMIN C PO) Take 1 tablet by mouth daily.     atorvastatin  (LIPITOR) 80 MG tablet Take 1 tablet (80 mg total) by mouth daily. 90 tablet 3   Cholecalciferol (VITAMIN D3) 125 MCG (5000 UT) TABS Take 1 tablet by mouth daily.     MEGARED OMEGA-3 KRILL  OIL PO Take 1 capsule by mouth daily.     omeprazole  (PRILOSEC) 40 MG capsule Take 1 capsule (40 mg total) by mouth daily. 30 capsule 11   valsartan -hydrochlorothiazide  (DIOVAN -HCT) 160-12.5 MG tablet TAKE 1 TABLET BY MOUTH EVERY DAY 90 tablet 3   zinc gluconate 50 MG tablet Take 150 mg by mouth daily.     gabapentin  (NEURONTIN ) 100 MG capsule Take 100 mg by mouth 3 (three) times daily.     Current Facility-Administered Medications  Medication Dose Route Frequency Provider Last Rate Last Admin   0.9 %  sodium chloride  infusion  500 mL Intravenous Continuous Tametria Aho, Gordy HERO, MD        Allergies as of 08/08/2024   (No Known Allergies)    Family History  Problem  Relation Age of Onset   Depression Mother    Anxiety disorder Mother    Colon polyps Mother    Diabetes Mother    Multiple sclerosis Mother    Depression Father    Anxiety disorder Father    Heart disease Father        Pacemaker   Heart attack Father    Stroke Father    Anxiety disorder Sister    Heart attack Paternal Grandfather    Anxiety disorder Paternal Grandmother    Cancer Paternal Grandmother        ?   Heart disease Other    Colon cancer Neg Hx    Esophageal cancer Neg Hx    Stomach cancer Neg Hx    Rectal cancer Neg Hx     Social History   Socioeconomic History   Marital status: Married    Spouse name: Edward [name]   Number of children: 2   Years of education: 12   Highest education level: High school graduate  Occupational History   Occupation: Retired  Tobacco Use   Smoking status: Never    Passive exposure: Never   Smokeless tobacco: Never  Vaping Use   Vaping status: Never Used  Substance and Sexual Activity   Alcohol use: Yes    Alcohol/week: 4.0 standard drinks of alcohol    Types: 4 Glasses of wine per week    Comment: 4 drinks  a day   Drug use: Never   Sexual activity: Yes  Other Topics Concern   Not on file  Social History Narrative   Married, two children, three grands. Lives in [place] .   Social Drivers of Health   Financial Resource Strain: Not on file  Food Insecurity: Not on file  Transportation Needs: Not on file  Physical Activity: Not on file  Stress: Not on file  Social Connections: Unknown (01/19/2022)   [facility]   Social Network    Social Network: Not on file  Intimate Partner Violence: Unknown (12/11/2021)   Received from [facility]   HITS    Physically Hurt: Not on file    Insult or Talk Down To: Not on file    Threaten Physical Harm: Not on file    Scream or Curse: Not on file    Physical Exam: Vital signs in last 24 hours: @BP  (!) 144/89   Pulse 79   Temp 97.9 F (36.6 C)   Ht 5' 9  (1.753 m)   Wt 230 lb (104.3 kg)   SpO2 97%   BMI 33.97 kg/m  GEN: NAD EYE: Sclerae anicteric ENT: MMM CV: Non-tachycardic Pulm: CTA b/l GI: Soft, NT/ND NEURO:  Alert & Oriented x 3  Gordy Starch, MD [facility]  08/08/2024 1:42 PM

## 2024-08-08 NOTE — Progress Notes (Signed)
 Pt's states no medical or surgical changes since previsit or office visit.

## 2024-08-08 NOTE — Progress Notes (Signed)
 Report to PACU, RN, vss, BBS= Clear.

## 2024-08-08 NOTE — Patient Instructions (Signed)
 Thank you for letting us  take care of your healthcare needs today. Please see handouts given to you on Hiatal Hernia. Await pathology results.   YOU HAD AN ENDOSCOPIC PROCEDURE TODAY AT THE  ENDOSCOPY CENTER:   Refer to the procedure report that was given to you for any specific questions about what was found during the examination.  If the procedure report does not answer your questions, please call your gastroenterologist to clarify.  If you requested that your care partner not be given the details of your procedure findings, then the procedure report has been included in a sealed envelope for you to review at your convenience later.  YOU SHOULD EXPECT: Some feelings of bloating in the abdomen. Passage of more gas than usual.  Walking can help get rid of the air that was put into your GI tract during the procedure and reduce the bloating. If you had a lower endoscopy (such as a colonoscopy or flexible sigmoidoscopy) you may notice spotting of blood in your stool or on the toilet paper. If you underwent a bowel prep for your procedure, you may not have a normal bowel movement for a few days.  Please Note:  You might notice some irritation and congestion in your nose or some drainage.  This is from the oxygen used during your procedure.  There is no need for concern and it should clear up in a day or so.  SYMPTOMS TO REPORT IMMEDIATELY:   Following upper endoscopy (EGD)  Vomiting of blood or coffee ground material  New chest pain or pain under the shoulder blades  Painful or persistently difficult swallowing  New shortness of breath  Fever of 100F or higher  Black, tarry-looking stools  For urgent or emergent issues, a gastroenterologist can be reached at any hour by calling [phone]. Do not use MyChart messaging for urgent concerns.    DIET:  We do recommend a small meal at first, but then you may proceed to your regular diet.  Drink plenty of fluids but you should avoid  alcoholic beverages for 24 hours.  ACTIVITY:  You should plan to take it easy for the rest of today and you should NOT DRIVE or use heavy machinery until tomorrow (because of the sedation medicines used during the test).    FOLLOW UP: Our staff will call the number listed on your records the next business day following your procedure.  We will call around 7:15- 8:00 am to check on you and address any questions or concerns that you may have regarding the information given to you following your procedure. If we do not reach you, we will leave a message.     If any biopsies were taken you will be contacted by phone or by letter within the next 1-3 weeks.  Please call us  at [phone] if you have not heard about the biopsies in 3 weeks.    SIGNATURES/CONFIDENTIALITY: You and/or your care partner have signed paperwork which will be entered into your electronic medical record.  These signatures attest to the fact that that the information above on your After Visit Summary has been reviewed and is understood.  Full responsibility of the confidentiality of this discharge information lies with you and/or your care-partner.

## 2024-08-09 ENCOUNTER — Telehealth: Payer: Self-pay

## 2024-08-09 NOTE — Telephone Encounter (Signed)
  Follow up Call-     08/08/2024   12:31 PM  Call back number  Post procedure Call Back phone  # [phone]  Permission to leave phone message Yes     Patient questions:  Do you have a fever, pain , or abdominal swelling? No. Pain Score  0 *  Have you tolerated food without any problems? Yes.    Have you been able to return to your normal activities? Yes.    Do you have any questions about your discharge instructions: Diet   No. Medications  No. Follow up visit  No.  Do you have questions or concerns about your Care? No.  Actions: * If pain score is 4 or above: No action needed, pain <4.

## 2024-08-13 LAB — SURGICAL PATHOLOGY

## 2024-08-14 ENCOUNTER — Ambulatory Visit: Payer: Self-pay | Admitting: Internal Medicine
